# Patient Record
Sex: Male | Born: 1940
Health system: Southern US, Community
[De-identification: ages and names within clinical notes are randomized; demographics above are authoritative.]

## PROBLEM LIST (undated history)

## (undated) DIAGNOSIS — K603 Anal fistula, unspecified: Secondary | ICD-10-CM

## (undated) DIAGNOSIS — K648 Other hemorrhoids: Secondary | ICD-10-CM

## (undated) DIAGNOSIS — Z87438 Personal history of other diseases of male genital organs: Secondary | ICD-10-CM

## (undated) DIAGNOSIS — K635 Polyp of colon: Secondary | ICD-10-CM

## (undated) DIAGNOSIS — E785 Hyperlipidemia, unspecified: Secondary | ICD-10-CM

## (undated) DIAGNOSIS — I2510 Atherosclerotic heart disease of native coronary artery without angina pectoris: Secondary | ICD-10-CM

## (undated) DIAGNOSIS — K5090 Crohn's disease, unspecified, without complications: Secondary | ICD-10-CM

## (undated) DIAGNOSIS — H269 Unspecified cataract: Secondary | ICD-10-CM

## (undated) DIAGNOSIS — Z8719 Personal history of other diseases of the digestive system: Secondary | ICD-10-CM

## (undated) DIAGNOSIS — K219 Gastro-esophageal reflux disease without esophagitis: Secondary | ICD-10-CM

## (undated) DIAGNOSIS — I10 Essential (primary) hypertension: Secondary | ICD-10-CM

## (undated) DIAGNOSIS — K4090 Unilateral inguinal hernia, without obstruction or gangrene, not specified as recurrent: Secondary | ICD-10-CM

## (undated) DIAGNOSIS — T7840XA Allergy, unspecified, initial encounter: Secondary | ICD-10-CM

## (undated) DIAGNOSIS — K224 Dyskinesia of esophagus: Secondary | ICD-10-CM

## (undated) HISTORY — PX: HERNIA REPAIR: SHX51

## (undated) HISTORY — DX: Gastro-esophageal reflux disease without esophagitis: K21.9

## (undated) HISTORY — PX: LIPOMA EXCISION: SHX5283

## (undated) HISTORY — DX: Anal fistula, unspecified: K60.30

## (undated) HISTORY — DX: Hyperlipidemia, unspecified: E78.5

## (undated) HISTORY — DX: Allergy, unspecified, initial encounter: T78.40XA

## (undated) HISTORY — DX: Personal history of other diseases of the digestive system: Z87.19

## (undated) HISTORY — DX: Anal fistula: K60.3

## (undated) HISTORY — DX: Dyskinesia of esophagus: K22.4

## (undated) HISTORY — PX: COLONOSCOPY: SHX174

## (undated) HISTORY — PX: UPPER GASTROINTESTINAL ENDOSCOPY: SHX188

## (undated) HISTORY — DX: Atherosclerotic heart disease of native coronary artery without angina pectoris: I25.10

## (undated) HISTORY — DX: Crohn's disease, unspecified, without complications: K50.90

## (undated) HISTORY — DX: Other hemorrhoids: K64.8

## (undated) HISTORY — DX: Polyp of colon: K63.5

## (undated) HISTORY — DX: Unilateral inguinal hernia, without obstruction or gangrene, not specified as recurrent: K40.90

## (undated) HISTORY — PX: PROSTATE SURGERY: SHX751

## (undated) HISTORY — DX: Unspecified cataract: H26.9

## (undated) HISTORY — DX: Personal history of other diseases of male genital organs: Z87.438

## (undated) HISTORY — DX: Essential (primary) hypertension: I10

## (undated) HISTORY — PX: EYE SURGERY: SHX253

---

## 1998-10-20 ENCOUNTER — Ambulatory Visit (HOSPITAL_COMMUNITY): Admission: RE | Admit: 1998-10-20 | Discharge: 1998-10-20 | Payer: Self-pay | Admitting: Internal Medicine

## 1998-10-20 ENCOUNTER — Encounter: Payer: Self-pay | Admitting: Internal Medicine

## 2000-09-22 ENCOUNTER — Encounter (INDEPENDENT_AMBULATORY_CARE_PROVIDER_SITE_OTHER): Payer: Self-pay

## 2000-09-22 ENCOUNTER — Other Ambulatory Visit: Admission: RE | Admit: 2000-09-22 | Discharge: 2000-09-22 | Payer: Self-pay | Admitting: Internal Medicine

## 2001-05-09 ENCOUNTER — Emergency Department (HOSPITAL_COMMUNITY): Admission: EM | Admit: 2001-05-09 | Discharge: 2001-05-09 | Payer: Self-pay | Admitting: Emergency Medicine

## 2004-02-05 ENCOUNTER — Ambulatory Visit: Payer: Self-pay | Admitting: Internal Medicine

## 2005-04-20 ENCOUNTER — Ambulatory Visit: Payer: Self-pay | Admitting: Internal Medicine

## 2005-12-21 ENCOUNTER — Ambulatory Visit: Payer: Self-pay | Admitting: Internal Medicine

## 2006-01-03 ENCOUNTER — Ambulatory Visit (HOSPITAL_COMMUNITY): Admission: RE | Admit: 2006-01-03 | Discharge: 2006-01-03 | Payer: Self-pay | Admitting: Cardiology

## 2006-01-03 ENCOUNTER — Encounter: Payer: Self-pay | Admitting: Internal Medicine

## 2006-01-03 ENCOUNTER — Encounter (INDEPENDENT_AMBULATORY_CARE_PROVIDER_SITE_OTHER): Payer: Self-pay | Admitting: *Deleted

## 2006-01-10 ENCOUNTER — Ambulatory Visit: Payer: Self-pay | Admitting: Internal Medicine

## 2007-02-12 ENCOUNTER — Ambulatory Visit: Payer: Self-pay | Admitting: Internal Medicine

## 2007-05-29 ENCOUNTER — Ambulatory Visit: Payer: Self-pay | Admitting: Cardiology

## 2007-06-01 ENCOUNTER — Ambulatory Visit: Payer: Self-pay

## 2007-06-19 ENCOUNTER — Ambulatory Visit: Payer: Self-pay | Admitting: Cardiology

## 2007-08-16 ENCOUNTER — Ambulatory Visit: Payer: Self-pay | Admitting: Cardiology

## 2007-09-17 ENCOUNTER — Ambulatory Visit: Payer: Self-pay | Admitting: Cardiology

## 2007-09-17 LAB — CONVERTED CEMR LAB
BUN: 15 mg/dL (ref 6–23)
Calcium: 9.1 mg/dL (ref 8.4–10.5)
Creatinine, Ser: 0.9 mg/dL (ref 0.4–1.5)
Eosinophils Relative: 1.8 % (ref 0.0–5.0)
GFR calc Af Amer: 109 mL/min
Glucose, Bld: 85 mg/dL (ref 70–99)
HCT: 42.4 % (ref 39.0–52.0)
Hemoglobin: 14.5 g/dL (ref 13.0–17.0)
INR: 0.9 (ref 0.8–1.0)
Monocytes Absolute: 0.8 10*3/uL (ref 0.1–1.0)
Monocytes Relative: 11.7 % (ref 3.0–12.0)
Neutro Abs: 4.1 10*3/uL (ref 1.4–7.7)
RDW: 12.3 % (ref 11.5–14.6)
WBC: 6.9 10*3/uL (ref 4.5–10.5)

## 2007-09-20 ENCOUNTER — Ambulatory Visit: Payer: Self-pay | Admitting: Cardiology

## 2007-09-20 ENCOUNTER — Inpatient Hospital Stay (HOSPITAL_BASED_OUTPATIENT_CLINIC_OR_DEPARTMENT_OTHER): Admission: RE | Admit: 2007-09-20 | Discharge: 2007-09-20 | Payer: Self-pay | Admitting: Cardiology

## 2007-09-20 ENCOUNTER — Encounter: Payer: Self-pay | Admitting: Family Medicine

## 2007-10-09 ENCOUNTER — Ambulatory Visit: Payer: Self-pay | Admitting: Cardiology

## 2007-10-15 ENCOUNTER — Ambulatory Visit: Payer: Self-pay

## 2007-10-15 ENCOUNTER — Encounter: Payer: Self-pay | Admitting: Cardiology

## 2007-12-26 ENCOUNTER — Ambulatory Visit: Payer: Self-pay | Admitting: Cardiology

## 2008-02-05 ENCOUNTER — Ambulatory Visit: Payer: Self-pay | Admitting: Cardiology

## 2008-05-06 ENCOUNTER — Encounter: Payer: Self-pay | Admitting: Cardiology

## 2008-05-06 ENCOUNTER — Ambulatory Visit: Payer: Self-pay | Admitting: Cardiology

## 2008-05-06 DIAGNOSIS — I10 Essential (primary) hypertension: Secondary | ICD-10-CM

## 2008-05-06 DIAGNOSIS — I2510 Atherosclerotic heart disease of native coronary artery without angina pectoris: Secondary | ICD-10-CM | POA: Insufficient documentation

## 2008-05-06 DIAGNOSIS — K5090 Crohn's disease, unspecified, without complications: Secondary | ICD-10-CM

## 2008-05-06 DIAGNOSIS — E782 Mixed hyperlipidemia: Secondary | ICD-10-CM

## 2008-05-14 DIAGNOSIS — Z860100 Personal history of colon polyps, unspecified: Secondary | ICD-10-CM | POA: Insufficient documentation

## 2008-05-14 DIAGNOSIS — K603 Anal fistula, unspecified: Secondary | ICD-10-CM | POA: Insufficient documentation

## 2008-05-14 DIAGNOSIS — Z8719 Personal history of other diseases of the digestive system: Secondary | ICD-10-CM | POA: Insufficient documentation

## 2008-05-14 DIAGNOSIS — K648 Other hemorrhoids: Secondary | ICD-10-CM | POA: Insufficient documentation

## 2008-05-14 DIAGNOSIS — K224 Dyskinesia of esophagus: Secondary | ICD-10-CM

## 2008-05-19 ENCOUNTER — Ambulatory Visit: Payer: Self-pay | Admitting: Internal Medicine

## 2008-06-16 ENCOUNTER — Ambulatory Visit: Payer: Self-pay | Admitting: Family Medicine

## 2008-06-16 ENCOUNTER — Ambulatory Visit: Payer: Self-pay | Admitting: Cardiology

## 2008-06-30 ENCOUNTER — Telehealth: Payer: Self-pay | Admitting: Family Medicine

## 2008-06-30 LAB — CONVERTED CEMR LAB
AST: 30 units/L (ref 0–37)
Alkaline Phosphatase: 66 units/L (ref 39–117)
CO2: 26 meq/L (ref 19–32)
Calcium: 9.1 mg/dL (ref 8.4–10.5)
Creatinine, Ser: 1 mg/dL (ref 0.4–1.5)
Direct LDL: 160.7 mg/dL
Glucose, Bld: 82 mg/dL (ref 70–99)
Hemoglobin: 14 g/dL (ref 13.0–17.0)
MCHC: 33.7 g/dL (ref 30.0–36.0)
RDW: 12.6 % (ref 11.5–14.6)
Sodium: 145 meq/L (ref 135–145)
Total Bilirubin: 1.4 mg/dL — ABNORMAL HIGH (ref 0.3–1.2)

## 2008-07-01 ENCOUNTER — Encounter: Payer: Self-pay | Admitting: Internal Medicine

## 2008-07-11 ENCOUNTER — Telehealth: Payer: Self-pay | Admitting: Cardiology

## 2008-08-07 ENCOUNTER — Ambulatory Visit: Payer: Self-pay | Admitting: Family Medicine

## 2008-08-07 DIAGNOSIS — Z87448 Personal history of other diseases of urinary system: Secondary | ICD-10-CM

## 2008-09-22 ENCOUNTER — Telehealth: Payer: Self-pay | Admitting: Family Medicine

## 2008-11-25 ENCOUNTER — Ambulatory Visit: Payer: Self-pay | Admitting: Cardiology

## 2008-11-25 DIAGNOSIS — E7800 Pure hypercholesterolemia, unspecified: Secondary | ICD-10-CM

## 2008-11-28 LAB — CONVERTED CEMR LAB
Calcium: 9.7 mg/dL (ref 8.4–10.5)
Creatinine, Ser: 0.9 mg/dL (ref 0.4–1.5)
GFR calc non Af Amer: 89.2 mL/min (ref >60)
Glucose, Bld: 90 mg/dL (ref 70–99)
Sodium: 139 meq/L (ref 135–145)

## 2008-12-08 ENCOUNTER — Telehealth: Payer: Self-pay | Admitting: Cardiology

## 2008-12-08 ENCOUNTER — Ambulatory Visit: Payer: Self-pay | Admitting: Cardiology

## 2008-12-08 LAB — CONVERTED CEMR LAB
Calcium: 9.3 mg/dL (ref 8.4–10.5)
Chloride: 100 meq/L (ref 96–112)
Creatinine, Ser: 0.9 mg/dL (ref 0.4–1.5)
GFR calc non Af Amer: 89.19 mL/min (ref >60)

## 2008-12-22 ENCOUNTER — Ambulatory Visit: Payer: Self-pay | Admitting: Cardiology

## 2008-12-23 LAB — CONVERTED CEMR LAB
GFR calc non Af Amer: 78.97 mL/min (ref >60)
Glucose, Bld: 76 mg/dL (ref 70–99)
Potassium: 4 meq/L (ref 3.5–5.1)
Sodium: 139 meq/L (ref 135–145)

## 2008-12-29 ENCOUNTER — Ambulatory Visit: Payer: Self-pay | Admitting: Family Medicine

## 2009-01-15 ENCOUNTER — Telehealth: Payer: Self-pay | Admitting: Cardiology

## 2009-02-11 ENCOUNTER — Ambulatory Visit: Payer: Self-pay | Admitting: Family Medicine

## 2009-02-13 LAB — CONVERTED CEMR LAB
Albumin: 4.1 g/dL (ref 3.5–5.2)
Alkaline Phosphatase: 52 units/L (ref 39–117)
Bilirubin, Direct: 0.1 mg/dL (ref 0.0–0.3)
Cholesterol: 168 mg/dL (ref 0–200)
HDL: 48.8 mg/dL (ref >39.00)
LDL Cholesterol: 102 mg/dL — ABNORMAL HIGH (ref 0–99)
Total Bilirubin: 1.3 mg/dL — ABNORMAL HIGH (ref 0.3–1.2)
Total CHOL/HDL Ratio: 3
Triglycerides: 87 mg/dL (ref 0.0–149.0)

## 2009-03-24 ENCOUNTER — Ambulatory Visit: Payer: Self-pay | Admitting: Cardiology

## 2009-03-30 LAB — CONVERTED CEMR LAB
BUN: 13 mg/dL (ref 6–23)
CO2: 30 meq/L (ref 19–32)
Chloride: 105 meq/L (ref 96–112)
Glucose, Bld: 93 mg/dL (ref 70–99)
Potassium: 4.3 meq/L (ref 3.5–5.1)

## 2009-05-20 ENCOUNTER — Ambulatory Visit: Payer: Self-pay | Admitting: Internal Medicine

## 2009-06-17 ENCOUNTER — Ambulatory Visit: Payer: Self-pay | Admitting: Cardiology

## 2009-06-23 ENCOUNTER — Ambulatory Visit: Payer: Self-pay | Admitting: Cardiology

## 2009-06-25 LAB — CONVERTED CEMR LAB
BUN: 14 mg/dL (ref 6–23)
CO2: 26 meq/L (ref 19–32)
Calcium: 9.5 mg/dL (ref 8.4–10.5)
Glucose, Bld: 100 mg/dL — ABNORMAL HIGH (ref 70–99)
Sodium: 137 meq/L (ref 135–145)

## 2009-08-10 ENCOUNTER — Ambulatory Visit: Payer: Self-pay | Admitting: Family Medicine

## 2009-08-10 DIAGNOSIS — H918X9 Other specified hearing loss, unspecified ear: Secondary | ICD-10-CM

## 2009-08-12 ENCOUNTER — Telehealth: Payer: Self-pay | Admitting: Cardiology

## 2009-08-18 ENCOUNTER — Encounter: Payer: Self-pay | Admitting: Family Medicine

## 2009-08-18 ENCOUNTER — Ambulatory Visit: Payer: Self-pay | Admitting: Cardiology

## 2009-08-20 LAB — CONVERTED CEMR LAB
BUN: 14 mg/dL (ref 6–23)
Chloride: 105 meq/L (ref 96–112)
Creatinine, Ser: 1 mg/dL (ref 0.4–1.5)

## 2010-02-23 NOTE — Progress Notes (Signed)
Summary: does pt need blood work due to increase in med  Phone Note Call from Patient Call back at [employer] [phone]   Caller: Patient Reason for Call: Talk to Nurse Summary of Call: per pt called pt went to dr. Lorelei Pont for physical. ts prescribe LOSARTAN POTASSIUM 100 MG TABS 1 by mouth daily. does pt need to have blood work due to the increase in meds by dr. Lorelei Pont.  Initial call taken by: Neil Crouch,  August 12, 2009 9:40 AM  Follow-up for Phone Call        spoke w/pt Dr Edilia Bo had increased med due elevated BP at his physical, he states Dr Lia Foyer always checks labs after changing meds and thought he should have it checked, pt wants to come here for labs due to convenience, bmet sch for 7/26 Kevan Rosebush, RN  August 12, 2009 10:09 AM

## 2010-02-23 NOTE — Assessment & Plan Note (Signed)
Summary: Jeffery [name]  Visit Type:  Follow-up Referring Provider:  n/a Primary Provider:  SOwens LofflerMD  CC:  cough.  History of Present Illness: Doing well except for some dry cough, worse at night. Denies symptoms otherwise.  No chest pain with activity.    Current Medications (verified): 1)  Amlodipine Besylate 10 Mg  Tabs (Amlodipine Besylate) .... Once Daily 2)  Folic Acid 1 Mg Tabs (Folic Acid) .... Take 1 Tablet By Mouth Once A Day. 3)  Prilosec 20 Mg Cpdr (Omeprazole) .... Take 1 Tablet By Mouth Every Day 4)  Centrum Silver  Tabs (Multiple Vitamins-Minerals) .... Take 1 Once Daily 5)  Osteo Bi-Flex Regular Strength 250-200 Mg Tabs (Glucosamine-Chondroitin) .... Take 2 Once Daily 6)  Sulfasalazine 500 Mg Tabs (Sulfasalazine) .... Take 2 Tablet By Mouth Three Times A Day 7)  Hydrochlorothiazide 12.5 Mg Tabs (Hydrochlorothiazide) ..Marland Kitchen. 1 By Mouth Daily 8)  Lipitor 20 Mg Tabs (Atorvastatin Calcium) ..Marland Kitchen. 1 By Mouth At Bedtime 9)  Lisinopril 20 Mg Tabs (Lisinopril) .... Take One Tablet By Mouth Daily  Allergies (verified): No Known Drug Allergies  Past History:  Past Medical History: Last updated: 06/16/2008 Hx of ANAL FISTULA (ICD-565.1) ESOPHAGITIS, HX OF (ICD-V12.79) INTERNAL HEMORRHOIDS (ICD-455.0) COLONIC POLYPS, HYPERPLASTIC, HX OF (ICD-V12.72) ESOPHAGEAL MOTILITY DISORDER (ICD-530.5) CAD (ICD-414.00) CROHN'S DISEASE (ICD-555.9) HYPERLIPIDEMIA TYPE IIB / III (ICD-272.2) HYPERTENSION, BENIGN (ICD-401.1)  Vital Signs:  Patient profile:   70year old male Height:      72 inches Weight:      202 pounds BMI:     27.50 Pulse rate:   72 / minute Resp:     18 per minute BP sitting:   148 / 88  (left arm)  Vitals Entered By: CLevora Angel CNA (Jun 17, 2009 11:55 AM)  Physical Exam  General:  Well developed, well nourished, in no acute distress. Head:  normocephalic and atraumatic Eyes:  PERRLA/EOM intact; conjunctiva and lids normal. Neck:  No JVP elevation Lungs:   Clear bilaterally to auscultation and percussion. Heart:  Non-displaced PMI, chest non-tender; regular rate and rhythm, S1, S2 without murmurs, rubs or gallops. Carotid upstroke normal, no bruit.  Pulses:  pulses normal in all 4 extremities Extremities:  No clubbing or cyanosis. Neurologic:  Alert and oriented x 3.   EKG  Procedure date:  06/17/2009  Findings:      NSR. WNL.  Cardiac Cath  Procedure date:  09/20/2007  Findings:       ANGIOGRAPHIC DATA:   1. Ventriculography was done in the RAO projection.  Overall systolic       function appeared preserved.  No definite segmental abnormalities       or contraction were identified.   2. The left main does demonstrate a little bit of tenting at the       ostium.  Multiple views were obtained, and there was good efflux of       contrast back into the aorta.  High-grade obstruction is not noted,       but the tenting would represent probably about a 30% overall       luminal reduction.  The left main itself appears to be smooth.   3. The left anterior descending artery courses to the apex.  The first       tiny diagonal branch in the LAO view does demonstrate some       narrowing, probably of 90%, but this is a 1-mm vessel or less.  The  second diagonal branch is a large-caliber vessel free of critical       disease in the mid and distal LAD.  All appear free of significant       high-grade disease.   4. The ramus intermedius is a moderate-size vessel with minimal       proximal luminal irregularity.   5. The circumflex coronary artery provides 2 marginal branches and has       no significant obstruction.   6. The right coronary artery also was minimally tapered at the ostium.       The right coronary, however, is smooth throughout providing a       posterior descending and posterolateral branch with minimal or mild       plaquing at the origin of the PDA, which is not hemodynamically       significant.      CONCLUSIONS:    1. Well-preserved left ventricular function.   2. A 30% ostial narrowing of the left main that does not appear to be       high-grade or flow-limiting.   3. Small first diagonal branch with abnormality noted in the LAO       cranial view.   4. Other findings as noted above.   Impression & Recommendations:  Problem # 1:  HYPERTENSION, BENIGN (ICD-401.1)  well controlled.  ACE cough.  Change to ARB.  Risks of meds reviewed.  Agreeble.  Check BMET after several days of treatment.   His updated medication list for this problem includes:    Amlodipine Besylate 10 Mg Tabs (Amlodipine besylate) ..... Once daily    Hydrochlorothiazide 12.5 Mg Tabs (Hydrochlorothiazide) .Marland Kitchen... 1 by mouth daily    Losartan Potassium 50 Mg Tabs (Losartan potassium) .Marland Kitchen... Take one tabley by mouth daily  Orders: EKG w/ Interpretation (93000)  Problem # 2:  HYPERCHOLESTEROLEMIA  IIA (ICD-272.0)  followed by Dr. Edilia Bo His updated medication list for this problem includes:    Lipitor 20 Mg Tabs (Atorvastatin calcium) .Marland Kitchen... 1 by mouth at bedtime  His updated medication list for this problem includes:    Lipitor 20 Mg Tabs (Atorvastatin calcium) .Marland Kitchen... 1 by mouth at bedtime  Problem # 3:  CAD (ICD-414.00) Not on ASA due to Crohn's  disease.  Stable without.   His updated medication list for this problem includes:    Amlodipine Besylate 10 Mg Tabs (Amlodipine besylate) ..... Once daily  His updated medication list for this problem includes:    Amlodipine Besylate 10 Mg Tabs (Amlodipine besylate) ..... Once daily  Orders: EKG w/ Interpretation (93000)  Patient Instructions: 1)  Your physician recommends that you return for lab work on: Tuesday Jun 23, 3009 (BMP 414.01, 401.9, 272.0)--Lab opens at 8:30 2)  Your physician has recommended you make the following change in your medication: Take your last dose of Lisinopril on Friday, Start Losartan 57m once a day on Saturday 3)  Your physician wants you to  follow-up in: 1 YEAR.   You will receive a reminder letter in the mail two months in advance. If you don't receive a letter, please call our office to schedule the follow-up appointment. Prescriptions: LOSARTAN POTASSIUM 50 MG TABS (LOSARTAN POTASSIUM) take one tabley by mouth daily  #90 x 3   Entered by:   LTheodosia Quay RN, BSN   Authorized by:   TWadie Lessen MD, [facility]  Signed by:   LTheodosia Quay RN, BSN on 06/17/2009   Method used:   Electronically  to        [id number]       [address]       [place], [place]  [address]       Ph: [phone]       Fax: [phone]   RxID:   [id number]

## 2010-02-23 NOTE — Assessment & Plan Note (Signed)
Summary: CPX.DLO   Vital Signs:  Patient profile:   70 year old male Weight:      198 pounds Temp:     98.8 degrees F oral Pulse rate:   72 / minute Pulse rhythm:   regular BP sitting:   112 / 68  (left arm) Cuff size:   regular  Vitals Entered By: Emelia Salisbury LPN (August 11, 5327 9:24 PM)  Serial Vital Signs/Assessments:  Time      Position  BP       Pulse  Resp  Temp     By                     135/78                         Owens Loffler MD  CC: 30 Minute checkup  Vision Screening:Left eye with correction: 20 / 20 Right eye with correction: 20 / 30 Both eyes with correction: 20 / 20        20db HL: Left  500 hz: 25db 1000 hz: 25db 2000 hz: 25db 4000 hz: No Response Right  500 hz: 25db 1000 hz: No Response 2000 hz: No Response 4000 hz: 25db    CC:  30 Minute checkup.  History of Present Illness: Here for Medicare AWV:  1.   Risk factors based on Past M, S, F history: 2.   Physical Activities: treadmill and walking 3.   Depression/mood: feels fine, no mood disturbance 4.   Hearing: note above, will go to audiology 5.   ADL's: no deficit 6.   Fall Risk: none, active, strong 7.   Home Safety: none noted 8.   Height, weight, &visual acuity: recorded 9.   Counseling: no dep / anxiety, feeling fine, doing well  10.   Labs ordered based on risk factors:  11.           Referral Coordination: Dr. Olevia Perches and Dr. Lia Foyer, sees reg 12.           Care Plan: up to date, vaccines, colon 13.           Cognitive Assessment: A and O x 4, no memory disturbance, working full time, keeps all financial records, no concerns.  Hearing - decreased   noticed lots of cramps in his leg and with his leg bent back some  some lgithheadedness rarely with standing up  HTN: noted now. is then elevated above goal at home, and he is also above goal my recheck here in the office today.  treadmill or walking  Crohns, good f/u with Dr. Olevia Perches, rare signs  CAD, on appropriate meds,  sees Dr. Lia Foyer regularly  Clinical Review Panels:  Prevention   Last Colonoscopy:  Location:  [facility].  (01/03/2006)   Last PSA:  0.86 (02/11/2009)  Immunizations   Last Flu Vaccine:  Fluvax 3+ (12/29/2008)   Last Pneumovax:  given (08/07/2008)   Last Zoster Vaccine:  given (08/07/2008)  Lipid Management   Cholesterol:  168 (02/11/2009)   LDL (bad choesterol):  102 (02/11/2009)   HDL (good cholesterol):  48.80 (02/11/2009)  Diabetes Management   Creatinine:  1.0 (06/23/2009)   Last Flu Vaccine:  Fluvax 3+ (12/29/2008)   Last Pneumovax:  given (08/07/2008)  CBC   WBC:  4.6 (06/16/2008)   RBC:  4.51 (06/16/2008)   Hgb:  14.0 (06/16/2008)   Hct:  41.4 (06/16/2008)   Platelets:  232.0 (  06/16/2008)   MCV  91.8 (06/16/2008)   MCHC  33.7 (06/16/2008)   RDW  12.6 (06/16/2008)   PMN:  59.7 (09/17/2007)   Lymphs:  25.9 (09/17/2007)   Monos:  11.7 (09/17/2007)   Eosinophils:  1.8 (09/17/2007)   Basophil:  0.9 (09/17/2007)  Complete Metabolic Panel   Glucose:  100 (06/23/2009)   Sodium:  137 (06/23/2009)   Potassium:  4.7 (06/23/2009)   Chloride:  100 (06/23/2009)   CO2:  26 (06/23/2009)   BUN:  14 (06/23/2009)   Creatinine:  1.0 (06/23/2009)   Albumin:  4.1 (02/11/2009)   Total Protein:  7.0 (02/11/2009)   Calcium:  9.5 (06/23/2009)   Total Bili:  1.3 (02/11/2009)   Alk Phos:  52 (02/11/2009)   SGPT (ALT):  30 (02/11/2009)   SGOT (AST):  24 (02/11/2009)   Allergies (verified): 1)  Ace Inhibitors  Past History:  Past medical, surgical, family and social histories (including risk factors) reviewed, and no changes noted (except as noted below).  Past Medical History: Reviewed history from 06/16/2008 and no changes required. Hx of ANAL FISTULA (ICD-565.1) ESOPHAGITIS, HX OF (ICD-V12.79) INTERNAL HEMORRHOIDS (ICD-455.0) COLONIC POLYPS, HYPERPLASTIC, HX OF (ICD-V12.72) ESOPHAGEAL MOTILITY DISORDER (ICD-530.5) CAD (ICD-414.00) CROHN'S DISEASE  (ICD-555.9) HYPERLIPIDEMIA TYPE IIB / III (ICD-272.2) HYPERTENSION, BENIGN (ICD-401.1)  Past Surgical History: Reviewed history from 06/16/2008 and no changes required. Eye surgery Hernia repair x 2 Lipoma of chest Prostate surgery x 2 ([facility], 1996)  Cath, 2009 ([place])  Family History: Reviewed history from 05/20/2009 and no changes required. M  passed from failure to thrive 82 Colonic polyps F 58 MI, was a smoker Family History of Colon Polyps: Mother No FH of Colon Cancer:  Social History: Reviewed history from 05/20/2009 and no changes required. married, two grils Solicitor for [employer] Patient is a former smoker.  Alcohol Use - yes occ Illicit Drug Use - no  Review of Systems      See HPI General:  Denies chills and fever. Eyes:  Denies blurring. ENT:  Complains of decreased hearing. CV:  Denies chest pain or discomfort, shortness of breath with exertion, swelling of feet, and swelling of hands. Resp:  Denies cough, shortness of breath, and wheezing. GI:  See HPI. GU:  Complains of decreased libido; denies discharge, erectile dysfunction, urinary frequency, and urinary hesitancy. MS:  Denies stiffness. Derm:  Denies rash. Neuro:  Denies memory loss and tingling. Psych:  Denies anxiety and depression. Endo:  Denies cold intolerance, excessive hunger, excessive thirst, excessive urination, heat intolerance, polyuria, and weight change. Heme:  Denies bleeding. Allergy:  Denies seasonal allergies.   Impression & Recommendations:  Problem # 1:  HEALTH MAINTENANCE EXAM (ICD-V70.0)  Health maintenance updated, POC reviewed  Orders: Subsequent annual wellness visit with prevention plan ([id number])  Problem # 2:  HYPERTENSION, BENIGN (ICD-401.1) Assessment: Deteriorated 734'L systolic, not at goal increase ARB cough much better off ace  His updated medication list for this problem includes:    Amlodipine Besylate 10 Mg Tabs (Amlodipine  besylate) ..... Once daily    Hydrochlorothiazide 12.5 Mg Tabs (Hydrochlorothiazide) .Marland Kitchen... 1 by mouth daily    Losartan Potassium 100 Mg Tabs (Losartan potassium) .Marland Kitchen... 1 by mouth daily  Problem # 3:  OTHER SPECIFIED FORMS OF HEARING LOSS (ICD-389.8) Assessment: New audiology formal referral  Orders: Audiology (Audio)  Problem # 4:  HYPERCHOLESTEROLEMIA  IIA (ICD-272.0) Assessment: Improved  His updated medication list for this problem includes:    Lipitor 20 Mg Tabs (Atorvastatin  calcium) .Marland Kitchen... 1 by mouth at bedtime  Problem # 5:  CROHN'S DISEASE (ICD-555.9) good control  Problem # 6:  CAD (ICD-414.00) crohns so no asa, but good protocol  His updated medication list for this problem includes:    Amlodipine Besylate 10 Mg Tabs (Amlodipine besylate) ..... Once daily    Hydrochlorothiazide 12.5 Mg Tabs (Hydrochlorothiazide) .Marland Kitchen... 1 by mouth daily    Losartan Potassium 100 Mg Tabs (Losartan potassium) .Marland Kitchen... 1 by mouth daily  Complete Medication List: 1)  Amlodipine Besylate 10 Mg Tabs (Amlodipine besylate) .... Once daily 2)  Folic Acid 1 Mg Tabs (Folic acid) .... Take 1 tablet by mouth once a day. 3)  Prilosec 20 Mg Cpdr (Omeprazole) .... Take 1 tablet by mouth every day 4)  Centrum Silver Tabs (Multiple vitamins-minerals) .... Take 1 once daily 5)  Osteo Bi-flex Regular Strength 250-200 Mg Tabs (Glucosamine-chondroitin) .... Take 2 once daily 6)  Sulfasalazine 500 Mg Tabs (Sulfasalazine) .... Take 2 tablet by mouth three times a day 7)  Hydrochlorothiazide 12.5 Mg Tabs (Hydrochlorothiazide) .Marland Kitchen.. 1 by mouth daily 8)  Lipitor 20 Mg Tabs (Atorvastatin calcium) .Marland Kitchen.. 1 by mouth at bedtime 9)  Losartan Potassium 100 Mg Tabs (Losartan potassium) .Marland Kitchen.. 1 by mouth daily  Other Orders: Audiometry [phone]) Vision Screening [phone])  Patient Instructions: 1)  Referral Appointment Information 2)  Day/Date: 3)  Time: 4)  Place/MD: 5)  Address: 6)  Phone/Fax: 7)  Patient given  appointment information. Information/Orders faxed/mailed.  Prescriptions: LOSARTAN POTASSIUM 100 MG TABS (LOSARTAN POTASSIUM) 1 by mouth daily  #90 x 3   Entered and Authorized by:   Owens Loffler MD   Signed by:   Owens Loffler MD on 08/10/2009   Method used:   [facility] to        [id number]       [address]       [place], [place]  [address]       Ph: [phone]       Fax: [phone]   RxID:   [id number]   Current Allergies (reviewed today): ACE INHIBITORS   Prevention & Chronic Care Immunizations   Influenza vaccine: Fluvax 3+  (12/29/2008)   Influenza vaccine due: 12/29/2009    Tetanus booster: Not documented    Pneumococcal vaccine: given  (08/07/2008)   Pneumococcal vaccine due: None    H. zoster vaccine: 08/07/2008: given  Colorectal Screening   Hemoccult: not done  (08/10/2009)    Colonoscopy: Location:  [facility].    (01/03/2006)   Colonoscopy action/deferral: Not indicated  (08/10/2009)   Colonoscopy due: 12/2010  Other Screening   PSA: 0.86  (02/11/2009)   PSA action/deferral: PSA ordered  (08/07/2008)   PSA due due: 02/11/2010   Smoking status: quit  (05/14/2008)  Lipids   Total Cholesterol: 168  (02/11/2009)   LDL: 102  (02/11/2009)   LDL Direct: 160.7  (06/16/2008)   HDL: 48.80  (02/11/2009)   Triglycerides: 87.0  (02/11/2009)    SGOT (AST): 24  (02/11/2009)   SGPT (ALT): 30  (02/11/2009)   Alkaline phosphatase: 52  (02/11/2009)   Total bilirubin: 1.3  (02/11/2009)    Lipid flowsheet reviewed?: Yes   Progress toward LDL goal: Improved  Hypertension   Last Blood Pressure: 112 / 68  (08/10/2009)   Serum creatinine: 1.0  (06/23/2009)   Serum potassium 4.7  (06/23/2009)    Hypertension flowsheet reviewed?: Yes  Progress toward BP goal: Unchanged  Self-Management Support :    Patient will work on the following items until the next clinic visit to reach self-care  goals:     Medications and monitoring: take my medicines every day, check my blood pressure  (08/10/2009)     Eating: eat foods that are low in salt, eat baked foods instead of fried foods  (08/10/2009)    Hypertension self-management support: BP self-monitoring log  (08/10/2009)    Lipid self-management support: Not documented     General Medical Physical Exam:  General Appearance:      Well developed, well nourished, in no acute distress  Head:      Inspection:     normocephalic without obvious abnormalities      Palpation:     no abnormal lesions palpable  Eyes:      External:     conjunctiva and lids normal      Pupils:     equal, round, and reactive to light and accommodation  Ears, Nose, Throat:      External:     no significant lesions or deformities noted      Otoscopic:     canals clear; tympanic membranes intact with normal light reflex      Hearing:     grossly intact      Nasal:     mucosa, septum, and turbinates normal      Dental:     good dentition      Pharynx:     tongue normal; posterior pharynx without erythema or exudate  Neck:      Thyroid:     no nodules, masses, tenderness, or enlargement  Respiratory:      Resp. effort:     no intercostal retractions or use of accessory muscles      Percussion:     no dullness      Palpation:     normal fremitus      Auscultation:     no rales, rhonchi, or wheezes  Chest Wall:      Chest wall:     no masses or gynecomastia  Cardiovascular:      Palpation:     no thrill or displacement of PMI      Auscultation:     normal S1 and  S2; no murmur, rub, or gallop  Gastrointestinal:      Abdomen:     soft and non-tender with normal bowel sounds; no masses      Liver/spleen:     normal to percussion; no enlargement or nodularity      Rectal:     normal rectal tone. No perianal disease. Stool is Hemoccult negative.      Stool:     not done  Genitourinary:      Scrotum:     no lesions, cysts, edema, or rash       Penis:     no lesions or discharge      Prostate:     no enlargement or nodularity  Musculoskeletal:      Gait/station:     normal gait; no ataxia      Digits/nails:     no cyanosis, clubbing, or petechiae  Lymphatic:      Neck:     no cervical adenopathy      Inguinal:     no inguinal adenopathy  Skin:      Inspection:     no rashes, suspicious lesions, or ulcerations  Palpation:     no subcutaneous nodules or induration  Neurological:      Sensory:     intact to touch  Psychiatric:      Judgement:     intact      Orientation:     oriented to time, place, and person      Memory:     intact for recent and remote events      Mood/affect:     no appearance of anxiety, depression, or agitation

## 2010-02-23 NOTE — Consult Note (Signed)
Summary: [facility]  [facility]   Imported By: Edmonia James 08/24/2009 10:37:20  _____________________________________________________________________  External Attachment:    Type:   Image     Comment:   External Document

## 2010-02-23 NOTE — Assessment & Plan Note (Signed)
Summary: 1 YR. F/U--CH.   History of Present Illness Visit Type: Follow-up Visit Primary GI MD: [name] MD Primary Provider: Owens Loffler MD Chief Complaint: 1 year follow up, Pt has no GI complaints History of Present Illness:   This is a 70 year old white male with Crohn's disease since 34. He is here for a yearly checkup. Patient's last visit was in April 2010. Patient is status post hemorrhoidal band ligation in 2007. Other medical problems include esophageal dysmotility, TURP and a history of colon polyps in 1999 and in 1994. Patient has been asymptomatic as for far as his Crohn's disease is concerned. He denies diarrhea, rectal bleeding or weight changes. He has been on Azulfadine 500 mg 2 tablets three times daily along with folic acid. He takes Prilosec 20 mg p.r.n. basis. His weight has increased 8 lbs.   GI Review of Systems      Denies abdominal pain, acid reflux, belching, bloating, chest pain, dysphagia with liquids, dysphagia with solids, heartburn, loss of appetite, nausea, vomiting, vomiting blood, weight loss, and  weight gain.        Denies anal fissure, black tarry stools, change in bowel habit, constipation, diarrhea, diverticulosis, fecal incontinence, heme positive stool, hemorrhoids, irritable bowel syndrome, jaundice, light color stool, liver problems, rectal bleeding, and  rectal pain.    Current Medications (verified): 1)  Amlodipine Besylate 10 Mg  Tabs (Amlodipine Besylate) .... Once Daily 2)  Folic Acid 1 Mg Tabs (Folic Acid) .... Take 1 Tablet By Mouth Once A Day..must Have Office Visit For Future Refills. 3)  Prilosec Otc 20 Mg Tbec (Omeprazole Magnesium) .... Take 1 As Needed 4)  Centrum Silver  Tabs (Multiple Vitamins-Minerals) .... Take 1 Once Daily 5)  Osteo Bi-Flex Regular Strength 250-200 Mg Tabs (Glucosamine-Chondroitin) .... Take 2 Once Daily 6)  Sulfasalazine 500 Mg Tabs (Sulfasalazine) .... Take 2 Tablet By Mouth Three Times A Day 7)   Hydrochlorothiazide 12.5 Mg Tabs (Hydrochlorothiazide) .Marland Kitchen.. 1 By Mouth Daily 8)  Lipitor 20 Mg Tabs (Atorvastatin Calcium) .Marland Kitchen.. 1 By Mouth At Bedtime 9)  Lisinopril 20 Mg Tabs (Lisinopril) .... Take One Tablet By Mouth Daily  Allergies (verified): No Known Drug Allergies  Past History:  Past Medical History: Reviewed history from 06/16/2008 and no changes required. Hx of ANAL FISTULA (ICD-565.1) ESOPHAGITIS, HX OF (ICD-V12.79) INTERNAL HEMORRHOIDS (ICD-455.0) COLONIC POLYPS, HYPERPLASTIC, HX OF (ICD-V12.72) ESOPHAGEAL MOTILITY DISORDER (ICD-530.5) CAD (ICD-414.00) CROHN'S DISEASE (ICD-555.9) HYPERLIPIDEMIA TYPE IIB / III (ICD-272.2) HYPERTENSION, BENIGN (ICD-401.1)  Past Surgical History: Reviewed history from 06/16/2008 and no changes required. 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  Alcohol Use - yes occ Illicit Drug Use - no  Review of Systems  The patient denies allergy/sinus, anemia, anxiety-new, arthritis/joint pain, back pain, blood in urine, breast changes/lumps, change in vision, confusion, cough, coughing up blood, depression-new, fainting, fatigue, fever, headaches-new, hearing problems, heart murmur, heart rhythm changes, itching, muscle pains/cramps, night sweats, nosebleeds, shortness of breath, skin rash, sleeping problems, sore throat, swelling of feet/legs, swollen lymph glands, thirst - excessive, urination - excessive, urination changes/pain, urine leakage, vision changes, and voice change.         Pertinent  positive and negative review of systems were noted in the above HPI. All other ROS was otherwise negative.   Vital  Signs:  Patient profile:   70 year old male Height:      72 inches Weight:      201 pounds BMI:     27.36 BSA:     2.14 Pulse rate:   68 / minute Pulse rhythm:   regular BP sitting:   118 / 60  Vitals Entered By: Genella Mech CMA Deborra Medina) (May 20, 2009 8:08 AM)  Physical Exam  General:  Well developed, well nourished, no acute distress. Eyes:  PERRLA, no icterus. Mouth:  No deformity or lesions, dentition normal. Neck:  Supple; no masses or thyromegaly. Lungs:  Clear throughout to auscultation. Heart:  Regular rate and rhythm; no murmurs, rubs,  or bruits. Abdomen:  Soft, nontender and nondistended. No masses, hepatosplenomegaly or hernias noted. Normal bowel sounds. Rectal:  normal rectal tone. No perianal disease. Stool is Hemoccult negative. Pulses:  Normal pulses noted. Extremities:  No clubbing, cyanosis, edema or deformities noted. Skin:  Intact without significant lesions or rashes. Psych:  Alert and cooperative. Normal mood and affect.   Impression & Recommendations:  Problem # 1:  CROHN'S DISEASE (ICD-555.9) Patient has had stable Crohn's disease of the terminal ileum since 1986. He is asymptomatic on Azulfadine 1 g twice a day and folic acid 1 mg ever day. He will continue the same regimen. He is up-to-date on his colonoscopy at this time.  Problem # 2:  Hx of ANAL FISTULA (ICD-565.1) Patient has periodic rectal irritation but no fistula on today's exam. Stool is Hemoccult negative. He will use over-the-counter topical preparations for rectal irritation.  Problem # 3:  ESOPHAGITIS, HX OF (ICD-V12.79) Patient has mild esophageal dysmotility which is well-controlled on p.r.n. Prilosec. There has been no progression of his symptoms.  Patient Instructions: 1)  continue Prilosec 20 mg p.o. p.r.n. 2)  Continued Azulfidine  1 g p.o. b.i.d. 3)  Recall colonoscopy December 2012. 4)  office visit one year. 5)  Copy sent to : Dr Lorelei Pont 6)  The medication list was  reviewed and reconciled.  All changed / newly prescribed medications were explained.  A complete medication list was provided to the patient / caregiver. Prescriptions: PRILOSEC 20 MG CPDR (OMEPRAZOLE) Take 1 tablet by mouth every day  #30 x 11   Entered by:   Madlyn Frankel CMA (AAMA)   Authorized by:   Lafayette Dragon MD   Signed by:   T[place]ley ([place]) on 05/20/2009   Method used:   [facility]        [phone][facility] (retail)       [address]       [place],   [address]       Ph: [phone]       Fax: [phone]   RxID:   [id number]

## 2010-03-10 ENCOUNTER — Ambulatory Visit (INDEPENDENT_AMBULATORY_CARE_PROVIDER_SITE_OTHER): Payer: Medicare Other | Admitting: Family Medicine

## 2010-03-10 ENCOUNTER — Encounter: Payer: Self-pay | Admitting: Family Medicine

## 2010-03-10 DIAGNOSIS — H6990 Unspecified Eustachian tube disorder, unspecified ear: Secondary | ICD-10-CM

## 2010-03-10 DIAGNOSIS — H6980 Other specified disorders of Eustachian tube, unspecified ear: Secondary | ICD-10-CM | POA: Insufficient documentation

## 2010-03-17 NOTE — Assessment & Plan Note (Signed)
Summary: CONGESTION/CLE   MEDICARE/UHC   Vital Signs:  Patient profile:   70 year old male Weight:      198.25 pounds Temp:     98.3 degrees F oral Pulse rate:   76 / minute Pulse rhythm:   regular BP sitting:   108 / 78  (left arm) Cuff size:   large CC: Head congestion x3 weeks Comments Ear pressure, headache   History of Present Illness: CC: head congestion  3wk h/o "stuffed up head" as well as L ear soreness and squeaking.  + clear nasal discharge and some blood.  Also with L sided headache.  Feels stuck and not improving.  Taking pseudophed.  No hearing changes or tinnitus.  No fevers/chills, abd pain, n/v/d, rashes, myalgias, arthralgias.  No tooth pain.  + granddaughter sick 2 wks ago, no smokers at home.  No h/o asthma.  Current Medications (verified): 1)  Amlodipine Besylate 10 Mg  Tabs (Amlodipine Besylate) .... Once Daily 2)  Folic Acid 1 Mg Tabs (Folic Acid) .... Take 1 Tablet By Mouth Once A Day. 3)  Prilosec 20 Mg Cpdr (Omeprazole) .... Take 1 Tablet By Mouth Every Day 4)  Centrum Silver  Tabs (Multiple Vitamins-Minerals) .... Take 1 Once Daily 5)  Osteo Bi-Flex Regular Strength 250-200 Mg Tabs (Glucosamine-Chondroitin) .... Take 2 Once Daily 6)  Sulfasalazine 500 Mg Tabs (Sulfasalazine) .... Take 2 Tablet By Mouth Three Times A Day 7)  Hydrochlorothiazide 12.5 Mg Tabs (Hydrochlorothiazide) .Marland Kitchen.. 1 By Mouth Daily 8)  Lipitor 20 Mg Tabs (Atorvastatin Calcium) .Marland Kitchen.. 1 By Mouth At Bedtime 9)  Losartan Potassium 100 Mg Tabs (Losartan Potassium) .Marland Kitchen.. 1 By Mouth Daily  Allergies: 1)  Ace Inhibitors  Past History:  Past Medical History: Last updated: 06/16/2008 Hx of ANAL FISTULA (ICD-565.1) ESOPHAGITIS, HX OF (ICD-V12.79) INTERNAL HEMORRHOIDS (ICD-455.0) COLONIC POLYPS, HYPERPLASTIC, HX OF (ICD-V12.72) ESOPHAGEAL MOTILITY DISORDER (ICD-530.5) CAD (ICD-414.00) CROHN'S DISEASE (ICD-555.9) HYPERLIPIDEMIA TYPE IIB / III (ICD-272.2) HYPERTENSION, BENIGN  (ICD-401.1)  Past Surgical History: Last updated: 06/16/2008 Eye surgery Hernia repair x 2 Lipoma of chest Prostate surgery x 2 (TURP, 1996)  Cath, 2009 ([facility])  Social History: Last updated: 05/20/2009 married, 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  Alcohol Use - yes occ Illicit Drug Use - no  Review of Systems       per HPI  Physical Exam  General:  Well developed, well nourished, in no acute distress Head:  no significant lesions or deformities noted  no sinus tenderness Eyes:  pupils equal, pupils round, pupils reactive to light, and pupils react to accomodation.   Ears:  TMs clear bilaterally, canals clear Nose:  mild mucosal pallor Mouth:  Oral mucosa and oropharynx without lesions or exudates.  Teeth in good repair. Neck:  No deformities, masses, or tenderness no LAD Lungs:  Normal respiratory effort, chest expands symmetrically. Lungs are clear to auscultation, no crackles or wheezes. Heart:  Regular rate and rhythm; no murmurs, rubs or bruits.   Impression & Recommendations:  Problem # 1:  EUSTACHIAN TUBE DYSFUNCTION, LEFT (ICD-381.81) no evidence of infection currently.  recommend start flonase as well as saline, guaifenesin.  update if not improving.  red flags to return discussed.  Complete Medication List: 1)  Amlodipine Besylate 10 Mg Tabs (Amlodipine besylate) .... Once daily 2)  Folic Acid 1 Mg Tabs (Folic acid) .... Take 1 tablet by mouth once a day. 3)  Prilosec 20 Mg Cpdr (Omeprazole) .... Take 1 tablet  by mouth every day 4)  Centrum Silver Tabs (Multiple vitamins-minerals) .... Take 1 once daily 5)  Osteo Bi-flex Regular Strength 250-200 Mg Tabs (Glucosamine-chondroitin) .... Take 2 once daily 6)  Sulfasalazine 500 Mg Tabs (Sulfasalazine) .... Take 2 tablet by mouth three times a day 7)  Hydrochlorothiazide 12.5 Mg Tabs (Hydrochlorothiazide) .Marland Kitchen.. 1 by mouth daily 8)  Lipitor 20 Mg Tabs (Atorvastatin  calcium) .Marland Kitchen.. 1 by mouth at bedtime 9)  Losartan Potassium 100 Mg Tabs (Losartan potassium) .Marland Kitchen.. 1 by mouth daily 10)  Flonase 50 Mcg/act Susp (Fluticasone propionate) .... 2 sprays in l nostril daily  Patient Instructions: 1)  Sounds like sinuses are somewhat congested. 2)  try either neti pot to drain sinuses or nasal saline spray. 3)  simple mucinex with plenty of fluid to help break up congestion. 4)  I've sent in medicine called flonase, use on L side 2 sprays daily. 5)  If not better, let us know. 6)  if worsening or fevers (>101.5), or worsening nasal discharge, you may need to return. Prescriptions: FLONASE 50 MCG/ACT SUSP (FLUTICASONE PROPIONATE) 2 sprays in L nostril daily  #1 x 1   Entered and Authorized by:   Ria Bush  MD   Signed by:   Ria Bush  MD on 03/10/2010   Method used:   Electronically to        [facility] [phone]* (retail)       [address]       [place], [place]  [address]       Ph: [phone]       Fax: [phone]   RxID:   [id number]    Orders Added: 1)  Est. Patient Level III [37944]    Current Allergies (reviewed today): ACE INHIBITORS  Appended Document: CONGESTION/CLE   [place]    Clinical Lists Changes  Orders: Added new Service order of Prescription Created Electronically [phone]) - Signed

## 2010-04-23 ENCOUNTER — Other Ambulatory Visit: Payer: Self-pay | Admitting: Internal Medicine

## 2010-06-08 NOTE — Cardiovascular Report (Signed)
NAME:  ISAISH, ALEMU NO.:  [id number]   MEDICAL RECORD NO.:  [id number]          PATIENT TYPE:  OIB   LOCATION:  1962                         FACILITY:  [facility]   PHYSICIAN:  [name]. Lia Foyer, MD, FACCDATE OF BIRTH:  10/12/1940   DATE OF PROCEDURE:  09/20/2007  DATE OF DISCHARGE:                            CARDIAC CATHETERIZATION   INDICATIONS:  Mr. Steil is a 70 year old gentleman who presents with  some exertional chest tightness.  This has been noticeable over the past  few months.  He had a borderline myocardial perfusion imaging study,  without a large area of high-risk ischemia.  Nonetheless, given his  symptoms, it was felt that cardiac catheterization would be worthwhile.  Risks, benefits, and alternatives were discussed with the patient in  detail, and he consented to proceed.   PROCEDURES:  1. Left heart catheterization.  2. Selective coronary arteriography.  3. Selective left ventriculography.   DESCRIPTION OF PROCEDURE:  The patient was brought to the  Catheterization Laboratory and prepped and draped in the usual fashion.  Through an anterior puncture, the femoral artery was entered, 4-French  sheath was placed.  We then took views of the left coronary artery.  There was very mild damping with engagement, we elected to use some  intracoronary nitroglycerin and exchanged for a 5-curve catheter.  With  this, after injection, there was some mild damping, not present before  an injection.  Overall, there was no evidence of calcification of the  left main artery.  Views of the left coronary artery were obtained  without complication.  A Noto catheter was used to inject the right  coronary vessel.  Ventriculography was performed in the RAO projection.  There were no complications.  He was taken to the holding area in  satisfactory clinical condition for sheath removal.  I spoke with his  family, and reviewed the pictures with them in the reviewing  room.   HEMODYNAMIC DATA:  1. Central aortic pressure is 133/70, mean 95.  2. Left ventricular pressure 142/14.  3. There was not a gradient on pullback across the aortic valve.   ANGIOGRAPHIC DATA:  1. Ventriculography was done in the RAO projection.  Overall systolic      function appeared preserved.  No definite segmental abnormalities      or contraction were identified.  2. The left main does demonstrate a little bit of tenting at the      ostium.  Multiple views were obtained, and there was good efflux of      contrast back into the aorta.  High-grade obstruction is not noted,      but the tenting would represent probably about a 30% overall      luminal reduction.  The left main itself appears to be smooth.  3. The left anterior descending artery courses to the apex.  The first      tiny diagonal branch in the LAO view does demonstrate some      narrowing, probably of 90%, but this is a 1-mm vessel or less.  The  second diagonal branch is a large-caliber vessel free of critical      disease in the mid and distal LAD.  All appear free of significant      high-grade disease.  4. The ramus intermedius is a moderate-size vessel with minimal      proximal luminal irregularity.  5. The circumflex coronary artery provides 2 marginal branches and has      no significant obstruction.  6. The right coronary artery also was minimally tapered at the ostium.      The right coronary, however, is smooth throughout providing a      posterior descending and posterolateral branch with minimal or mild      plaquing at the origin of the PDA, which is not hemodynamically      significant.   CONCLUSIONS:  1. Well-preserved left ventricular function.  2. A 30% ostial narrowing of the left main that does not appear to be      high-grade or flow-limiting.  3. Small first diagonal branch with abnormality noted in the LAO      cranial view.  4. Other findings as noted above.   DISPOSITION:   1. We will see the patient back in the office, and a medical therapy      program will be recommended.  2. Repeat exercise testing in 6 months will be suggested.  3. We will likely obtain an outpatient echocardiogram to evaluate the      patient's borderline gradient on pullback across the aortic valve.      [name]. Lia Foyer, MD, [facility]  Electronically Signed     TDS/MEDQ  D:  09/20/2007  T:  09/20/2007  Job:  [id number]   cc:   Nelda Severe. Juventino Slovak, M.D.  [facility]

## 2010-06-08 NOTE — Assessment & Plan Note (Signed)
[facility] OFFICE NOTE   Donaven, Criswell SHUN PLETZ                        MRN:          [id number]  DATE:10/09/2007                            DOB:          05/15/1940    Jeffery [name] is in for a followup visit.  We recently had him come in for  cardiac catheterization.  According to his family, he has been under a  fair amount of job-related stress with the economy.  He is a Diplomatic Services operational officer.  [name] had had a radionuclide imaging study done in May of  this year.  He exercised for 8 minutes and 15 seconds without chest pain  and had borderline changes.  There was mild decrease in inferior wall  that was thought to be due to diaphragmatic attenuation with an ejection  fraction of 64%.  He continues to have a little bit of tightness and  some shortness of breath at times, although it has not progressed.  He  is starting to exercise in a more organized fashion, typically doing it  in the morning.  At diagnostic catheterization, his overall LV systolic  function appeared to be preserved.  There is some tenting of the left  main ostium, but there did not appear to be more than about 30% luminal  reduction.  The first tiny diagonal branch does demonstrate narrowing of  90%, but it was a 1 mm vessel.  There was minimal irregularity at the  ramus intermedius.  No significant marginal disease and no significant  RCA disease.  Overall, there was a small gradient on pullback across the  aortic valve.   PHYSICAL EXAMINATION:  VITAL SIGNS:  The blood pressure is 124/82.  The  pulse is 64.  LUNGS:  Fields are clear.  CARDIAC:  Rhythm is regular.  Careful examination of the precordium did  not reveal significant murmur.  His groin is well healed.   His recent chest x-ray revealed probable chronic slight biapical pleural  thickening, but with heart size and mediastinal contours within normal  limits.  Topographically, clear lung  fields.   In general, there is no obvious active disease.   We will continue to follow [name] fairly closely.  We will get a 2-D  echocardiogram because of the valvular gradient on pullback.  I doubt  that we will find much with this.  We will also see him in follow up in  3 months.     [name]. Lia Foyer, MD, [facility]  Electronically Signed    TDS/MedQ  DD: 10/09/2007  DT: 10/09/2007  Job #: [id number]   cc:   Nelda Severe. Juventino Slovak, M.D.

## 2010-06-08 NOTE — Letter (Signed)
Jun 19, 2007    Nelda Severe. Juventino Slovak, M.D.  [address] [place], [place] [address]   RE:  Jeffery [name], Jeffery [name]  MRN:  [id number]  /  DOB:  1940-12-21   Dear Clair Gulling:   I had the pleasure of seeing Jesiel Garate in the office today in  followup.  In general, he is about the same.  He underwent an exercise  Myoview study.  To basically summarize, this did not demonstrate  significant perfusion deficit.  There was with 8 minutes in 15 seconds  of exercise, some minor nonspecific ST abnormalities, and there was a  mild decrease in activity in the inferior wall which was the same with  stress and thought to not represent significant ischemia but probably  diaphragmatic attenuation.  He had fairly good exercise capacity and a  normal blood pressure response.  His overall ejection fraction was 64%.  He and I went carefully through the images today, and I explained to him  the results of the findings.  He personally thinks that some of this may  be due to lack of activity and exercise.   PHYSICAL EXAMINATION:  VITAL SIGNS:  Today, his blood pressure is  120/80, and the pulse is 72.  LUNGS:  The lung fields are clear.  CARDIAC:  The cardiac rhythm is regular.   He and I have had a thorough discussion.  He does not have a high-risk  scan.  As I pointed out to him, it does not necessarily mean that he  does not have underlying coronary disease.  It simply represents a non  high-risk scan.  With this, we both agreed that he would begin an  exercise program.  I have recommended exercise 3-4 times a week for 45  minutes to an hour.  We thought that he would best start with walking.  We will reassess his status in about 6 weeks to see how he is feeling to  see if  has improved.  I told him that we have not set in stone  definitive plans, but this would be helpful.  He also has had a  tremendous amount of difficulty  taking statins, and although his LDL is modestly elevated on Zetia, it  seems  like the best strategy for now.   Thanks so much for allowing me to share in his care.  I will send you a  note when I see him back to just reevaluate how he has done.    Sincerely,      [name]. Lia Foyer, MD, [facility]  Electronically Signed    TDS/MedQ  DD: 06/19/2007  DT: 06/19/2007  Job #: [id number]

## 2010-06-08 NOTE — Letter (Signed)
May 29, 2007    Nelda Severe. Juventino Slovak, M.D.  [address], [place], [place] [address]   RE:  Jeffery [name], Jeffery [name]  MRN:  [id number]  /  DOB:  1940/08/07   Dear Clair Gulling:   Thanks so much for asking me to see Jeffery [name] in cardiac consultation.  As you know, he has had some vague discomfort in his chest associated  with some shortness of breath.  His overall exercise tolerance has  declined.  The patient has been under some stress at work, but he does  not feel that this has been necessarily contributory to his symptoms.   I have sent a copy of my note.  His exam was unremarkable, and EKG which  you provided also was unremarkable.  We have recommended that he undergo  an exercise Myoview stress testing as a screening tool for possible  coronary artery disease given his symptoms.  One notable feature is a  strong family history of coronary disease in his father and his father's  multiple siblings.   Thanks so much for allowing [place] to share in his care, and I will be back  in touch with you regarding the results of his study.    Sincerely,      [name]. Lia Foyer, MD, [facility]  Electronically Signed    TDS/MedQ  DD: 05/29/2007  DT: 05/29/2007  Job #: [id number]

## 2010-06-08 NOTE — Assessment & Plan Note (Signed)
[facility] OFFICE NOTE   [name], Jeffery [name]                        MRN:          [id number]  DATE:02/05/2008                            DOB:          03/10/1940    Jeffery [name] is in for followup.  He is really doing well.  He has minimal  if any chest discomfort.  His blood pressure has been improved at home  being in the 130 range.  He denies current chest pain.   He has come off aspirin in the past because of his Crohn's; however, it  really never bothered him all that much.  However, that is the medical  reason he was not on aspirin.   MEDICATIONS:  1. Zetia 10 mg daily.  2. Sulfasalazine 2 tablets t.i.d.  3. Folic acid 1 mg daily.  4. Multivitamin daily.  5. Osteo Bi-Flex 2 daily.  6. Amlodipine 5 mg daily.   PHYSICAL EXAMINATION:  The blood pressure was 143/84, on repeat by me is  130/80, pulse 76.  Lung fields clear.  Cardiac rhythm is regular.  Extremities revealed no edema.   Overall, the patient continues to do well.  We did not do an EKG today,  just one has been done 6 weeks earlier.   IMPRESSION:  1. Class I angina pectoris.  2. Coronary artery disease as described in the previous      catheterization.  3. Hypertension under improved control.   PLAN:  1. Return to clinic in 4 months.  2. Continue to follow up with his primary care physician.     [name]. Lia Foyer, MD, [facility]  Electronically Signed    TDS/MedQ  DD: 02/05/2008  DT: 02/05/2008  Job #: [id number]   cc:   Nelda Severe. Juventino Slovak, M.D.

## 2010-06-08 NOTE — Assessment & Plan Note (Signed)
Jeffery [name], Jeffery [name]                        MRN:          [id number]  DATE:05/29/2007                            DOB:          1940-04-14    CHIEF COMPLAINT:  Shortness of breath.   HISTORY OF PRESENT ILLNESS:  Jeffery [name] is a very delightful 70 year old  who works as a Public house manager for a Therapist, art.  He has been  followed by Dr. [name] in the GI clinic and is a patient of Dr.  Juventino Slovak.  His chief complaint has been that he has had some shortness of  breath and mild chest tightness.  Most of the time it occurs with  exertion.  Notably, this has come on slowly over a bit of time.  He has  never had nighttime or nocturnal symptoms.  He denies any progressive  shortness of breath in the past few weeks or symptoms at rest.  There is  no obvious radiation other than just a mild central tightness.  He is  now here for further evaluation.   PAST MEDICAL HISTORY:  1. Long history of Crohn's disease, has never had surgery.  2. The patient had surgery on his left breast many years ago which      turned out to be a benign tumor.  3. He had two prostate surgeries 10 years ago.  4. He has had eye surgery in the right eye.  5. He had heart catheterization 20 years ago by Dr. Claiborne Billings in 1991      which was normal.  6. He had a double hernia repair 20 years ago as well.   ALLERGIES:  He has no known drug allergies.   MEDICATIONS:  1. Zetia 10 mg daily.  2. Sulfasalazine 500 mg 2 tablets 3 times a day.  3. Folic acid 1 mg daily.  4. Multivitamin daily.  5 . Osteobiflex 2 daily.   FAMILY HISTORY:  Mother is alive at 20, has hypertension.  Father died  at 44 of a heart attack.  He has one sister in good health at 7.   SOCIAL HISTORY:  The patient is a nonsmoker.  He quit about 1980-1985.  He works as a Public house manager for a Therapist, art.   PHYSICAL EXAMINATION:  GENERAL:   He is alert and oriented in no acute  distress.  VITAL SIGNS:  Blood pressure is 130/90, equal in both arms.  The pulse  was 76.  HEENT:  Examination is grossly unremarkable.  NECK:  Supple.  Thyroid appears to be normal.  The lung fields are clear  to auscultation and percussion.  The carotid upstrokes are brisk.  CARDIAC:  Exam reveals nondisplaced PMI.  There are normal first and  second heart sounds without murmurs, rubs, or gallops.  No diastolic  murmurs are noted.  ABDOMEN:  Soft without obvious hepatosplenomegaly.  EXTREMITIES:  Reveal no edema.  NEUROLOGIC:  Exam was nonfocal.   Review of the electrocardiogram demonstrates normal sinus rhythm  essentially within normal limits.   IMPRESSION:  1.  Exertional dyspnea with very mild chest tightness which is      gradually progressive.  2. Hypercholesterolemia with history of statin  intolerance.  3. History of Crohn's disease.  4. Positive family history.   RECOMMENDATIONS:  I have discussed the findings with the patient.  He  does not have significant physical findings.  His lung fields are clear,  and his cardiac rhythm is normal.  He does have some shortness of  breath, but does note a definite change in pattern over time which has  been gradually and slowly progressive. Based on this, it would be our  impression that he would be best served by exercise Myoview imaging.  We  plan to get this information and have the patient back in followup.     [name]. Lia Foyer, MD, [facility]  Electronically Signed    TDS/MedQ  DD: 05/29/2007  DT: 05/29/2007  Job #: [id number]   cc:   Nelda Severe. Juventino Slovak, M.D.

## 2010-06-08 NOTE — Assessment & Plan Note (Signed)
Jeffery [name]   Jeffery [name], Jeffery [name]                        MRN:          [id number]  DATE:02/12/2007                            DOB:          1940/12/13    The patient is a 70 year old gentleman with Crohn's disease since 1986,  last colonoscopy in December of 2007 with banding of the hemorrhoids.  There was no evidence of active Crohn's disease in the colon or  ileocecal valve or terminal ileum.  He comes for one-year checkup.  The  patient remains asymptomatic.  There is occasional left middle quadrant  abdominal pain which he has complained of intermittently for the past 20  years, but on colonoscopy does not show any evidence of Crohn's disease.  His bowel habits are regular.  He denies diarrhea or rectal bleeding.  The banded hemorrhoids seem to be doing well.  He has occasional rectal  irritation which he treats with topical steroids.   There is also a history of fistula in ano in 1993, history of colon  polyps on colonoscopy in 1994.  He has nonspecific esophageal  dysmotility demonstrated on a barium swallow in 1996 after he complained  of dysphagia.  Pepcid AC seemed to help indicating that the problems may  be precipitated by intermittent gastroesophageal reflux disease.  Upper  endoscopy in 2000 showed grade 1 chronic esophagitis and negative  CLOtest.  The patient had a TURP in February of 1999 by Jeffery [name],  M.D.  His most recent PSA was normal.  The patient also had a complete  physical examination by Dr. Juventino [name] with all blood tests including CBC,  CMET, and PSA which were normal.   MEDICATIONS:  1. Azulfidine 1 gram p.o. b.i.d. sometimes t.i.d.  2. Folic acid 1 mg p.o. daily.  3. Pepcid AC p.r.n.  4. Osteo-BiFlex daily.  5. Centrum Silver.  6. Zetia 10 mg p.o. daily.  7. Folic acid.   PHYSICAL EXAMINATION:  VITAL SIGNS:  Blood pressure 122/80, [name] 60,  and weight 193  pounds.  GENERAL:  The patient was alert and oriented in no distress.  LUNGS:  Clear to auscultation.  HEART:  Normal S1 and S2.  ABDOMEN:  Soft and nontender with minimal discomfort in the left lower  and left middle quadrant, no distention, and normoactive bowel sounds.  Liver edge at the costal margin.  In the right lower quadrant there were  indurations in the abdominal wall consistent with either lipomas or  fibromas.  RECTAL:  Normal rectal tone, normal prostate.  Stool is hemoccult  negative.   IMPRESSION:  66. A 70 year old white male with stable Crohn's disease since 46.      He is up-to-date on his colonoscopy as of December of 2007.  2. Status post banding of the hemorrhoids.  3. Mild esophageal dysmotility as per barium swallow in 1997.  4. Colon polyp in 1999 and 1994.  5. Hyperlipidemia.  6. Status post Transurethral prostatic resection.  7. Right eye macular problems being followed by ophthalmologist.   PLAN:  1. Refill Azulfidine and folic acid.  2. Carbohydrate modified diet to lower his LDL.  I will see him again      in one year.     Jeffery [name]. Jeffery Perches, MD  Electronically Signed    DMB/MedQ  DD: 02/12/2007  DT: 02/12/2007  Job #: [id number]   cc:   Jeffery [name]. Jeffery [name], M.D.

## 2010-06-08 NOTE — Assessment & Plan Note (Signed)
[place] OFFICE NOTE   Shrihan, Putt JD MCCASTER                        MRN:          [id number]  DATE:12/26/2007                            DOB:          Jan 19, 1941    Mr. Fiumara is in for followup.  He still gets a little bit of tightness  in the chest.  It occurs only at the initial exercise, but the end of  exercise is not all that bad.  He denies progressive chest pain and his  symptoms had been well controlled.  Unfortunately, Dr. Delilah Shan is going  to a [facility]-type practice, and he wants to be referred to a [facility].  I gave him a location and familiarity with his  location.   MEDICATIONS:  1. Zetia 10 mg daily.  2. Sulfasalazine 2 tablets t.i.d.  3. Folic acid 1 mg daily.  4. Multivitamin one daily.  5. Osteo Bi-Flex 2 daily.   PHYSICAL EXAMINATION:  GENERAL:  He is alert and oriented in no  distress.  VITAL SIGNS:  Blood pressure was 150/92, pulse 62.  LUNG:  Fields are clear.  CARDIAC:  Rhythm is regular.   Electrocardiogram demonstrates normal sinus rhythm within normal limits.   IMPRESSION:  1. Class I angina pectoris.  2. Underlying coronary disease as previously described in the most      recent catheterization procedure.   DISPOSITION:  1. Add amlodipine 5 mg daily.  2. Discussion regarding Zetia.  3. Return to clinic in 6 weeks.     [name]. Lia Foyer, MD, [facility]  Electronically Signed    TDS/MedQ  DD: 12/26/2007  DT: 12/26/2007  Job #: [id number]

## 2010-06-08 NOTE — Assessment & Plan Note (Signed)
[place] OFFICE NOTE   Jeffery [name], Jeffery [name]                        MRN:          [id number]  DATE:08/16/2007                            DOB:          1940/03/08    Jeffery [name] is in for followup.  Since I last saw him, he has gotten out  and started to walk more.  He is walking about 3-4 times a week for  about 45 minutes.  In the first few minutes of exercise, he starts to  get discomfort in the chest.  This discomfort in the chest is consistent  with some moderate tightness which then gradually eases off.  His  radionuclide imaging study was read as a probable low-risk study.  The  patient underwent previous cardiac catheterization in 1991, and at that  time, he had no significant coronary obstruction.  The current study was  associated with 8 minutes and 15 seconds of exercise with fatigue and no  chest pain.  There were some mild ST-segment changes, but no definite  perfusion defect except for decrease in activity in the inferior wall  that was thought to be possibly diaphragmatic attenuation, although some  ischemia cannot be entirely excluded.  In the short axis views, there is  some possible mild late redistribution.  The patient has not had any  pain at rest.  Most of the time, this occurs over with exertion.   His past medical history is remarkable for a long history of Crohn  disease.  He had breast surgery many years ago.  He has had two prostate  surgeries.  He has had eye surgery.  He has had double hernia repair.   He has no known allergies.   His medications include Zetia 10 mg daily, sulfasalazine 500 mg 2  tablets t.i.d., folic acid daily, multivitamin daily, and Osteo Bi-Flex  two daily.   His mother is still alive, but his father did die of a cardiac event.  He has a sister who is in good health.   He quit smoking in 1980-1985.  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   On  his examination, he is alert and oriented in no distress.  The blood  pressure is 150/90, the pulse is 59.  Jugular veins not distended.  The  lung fields are clear.  The cardiac rhythm is regular.  The extremities  reveal no edema.  Distal pulses are intact.  Neurologic exam is  completely nonfocal.   EKG today reveals sinus bradycardia, otherwise within normal limits.   IMPRESSION:  1. Continued exertional dyspnea with very mild chest tightness and      fatigue.  2. Hypercholesterolemia with a history of statin intolerance.  3. History of Crohn disease.  4. Positive family history for coronary artery disease.  5. History of prior prostate surgery.  6. History of eye surgery.   PLAN:  I have discussed the case with the patient in detail.  Given the  fact that he continues to have discomfort with exertion even with a  fairly  low risk Myoview, it is difficult to be certain that he does not  have coronary disease.  We plan to get his laboratory studies, and  cardiac catheterization has been recommended.  Risks, benefits, and  alternatives have been discussed and he has agreed to proceed.  This  will be arranged in about 10 days, as he has plans next week.     [name]. Lia Foyer, MD, [facility]  Electronically Signed    TDS/MedQ  DD: 08/16/2007  DT: 08/17/2007  Job #: [id number]   cc:   Nelda Severe. Juventino Slovak, M.D.

## 2010-06-11 NOTE — Assessment & Plan Note (Signed)
[place] OFFICE NOTE   Jeffery [name], Jeffery [name]                        MRN:          [id number]  DATE:12/21/2005                            DOB:          02/12/1940    Jeffery [name] is a very nice 70 year old white male who has Crohn's disease  of the terminal ileum, status post fistula in[place] in 1993.  He also had  a colon polyp in 1999.  Last colonoscopy  was done in August 2002.  He  now comes with intermittent rectal bleeding which is bright red blood,  last episode about 2 weeks ago, soaked his underwear.  We have sent him  over-the-counter Anusol suppositories.  Bleeding has improved, but he  still feels some fullness in the rectum.   MEDICATIONS:  1. Azulfidine 500 mg, 2 q.d.  2. Folic acid 1 mg p.o. q.d.  3. Zetia 10 mg p.o. q.d.  4. Centrum Silver.  5. Multivitamins.   PHYSICAL EXAMINATION:  VITAL SIGNS:  Blood pressure 120/80, pulse 76,  weight 190 pounds.  GENERAL:  He was alert, oriented, in no distress.  LUNGS:  Clear to auscultation.  COR:  Normal S1, normal S2.  ABDOMEN:  Soft, nontender.  RECTAL:  Anoscopic exam shows normal perianal area, normal rectal tone,  2 large internal hemorrhoids which were cyanotic, full of blood, but no  active bleeding, no fissure or no pain.  Hemorrhoids did not collapse.  They were consistent with grade I internal hemorrhoids.  Two of them  were present.  No evidence of colitis.  Stool was heme-occult negative.   IMPRESSION:  1. Symptomatic first grade hemorrhoids causing intermittent rectal      bleeding.  2. Patient with Crohn's disease of the colon status post remote      history of anal fissure.  Currently no evidence of anal fissure.   PLAN:  1. Continue Anusol HC suppositories.  2. Patient to be scheduled for banding of his hemorrhoids.  He has      also due for a colonoscopy since last one was done more than 5      years ago.  We will schedule with a  routine colonoscopy  prep and      hemorrhoidal banding at the same time.     Jeffery [name]. Olevia Perches, MD  Electronically Signed    DMB/MedQ  DD: 12/21/2005  DT: 12/22/2005  Job #: [id number]

## 2010-06-17 ENCOUNTER — Ambulatory Visit (INDEPENDENT_AMBULATORY_CARE_PROVIDER_SITE_OTHER): Payer: Medicare Other | Admitting: Internal Medicine

## 2010-06-17 ENCOUNTER — Other Ambulatory Visit (INDEPENDENT_AMBULATORY_CARE_PROVIDER_SITE_OTHER): Payer: Medicare Other

## 2010-06-17 ENCOUNTER — Encounter: Payer: Self-pay | Admitting: Internal Medicine

## 2010-06-17 VITALS — BP 126/80 | HR 68 | Ht 73.0 in | Wt 187.0 lb

## 2010-06-17 DIAGNOSIS — R6889 Other general symptoms and signs: Secondary | ICD-10-CM

## 2010-06-17 DIAGNOSIS — R739 Hyperglycemia, unspecified: Secondary | ICD-10-CM

## 2010-06-17 DIAGNOSIS — K5000 Crohn's disease of small intestine without complications: Secondary | ICD-10-CM

## 2010-06-17 DIAGNOSIS — E782 Mixed hyperlipidemia: Secondary | ICD-10-CM

## 2010-06-17 DIAGNOSIS — K219 Gastro-esophageal reflux disease without esophagitis: Secondary | ICD-10-CM

## 2010-06-17 DIAGNOSIS — R7309 Other abnormal glucose: Secondary | ICD-10-CM

## 2010-06-17 LAB — COMPREHENSIVE METABOLIC PANEL
AST: 24 U/L (ref 0–37)
Albumin: 3.9 g/dL (ref 3.5–5.2)
Alkaline Phosphatase: 50 U/L (ref 39–117)
Potassium: 3.7 mEq/L (ref 3.5–5.1)
Sodium: 137 mEq/L (ref 135–145)
Total Protein: 6.2 g/dL (ref 6.0–8.3)

## 2010-06-17 LAB — CBC WITH DIFFERENTIAL/PLATELET
Basophils Relative: 0.4 % (ref 0.0–3.0)
Eosinophils Relative: 1.1 % (ref 0.0–5.0)
HCT: 39.4 % (ref 39.0–52.0)
Lymphs Abs: 1.6 10*3/uL (ref 0.7–4.0)
MCV: 92.6 fl (ref 78.0–100.0)
Monocytes Absolute: 0.6 10*3/uL (ref 0.1–1.0)
Platelets: 208 10*3/uL (ref 150.0–400.0)

## 2010-06-17 LAB — LIPID PANEL
Cholesterol: 134 mg/dL (ref 0–200)
LDL Cholesterol: 74 mg/dL (ref 0–99)
Total CHOL/HDL Ratio: 3

## 2010-06-17 MED ORDER — SULFASALAZINE 500 MG PO TABS: ORAL_TABLET | ORAL | Status: DC

## 2010-06-17 MED ORDER — FOLIC ACID 1 MG PO TABS: 1.0000 mg | ORAL_TABLET | Freq: Every day | ORAL | Status: DC

## 2010-06-17 NOTE — Patient Instructions (Signed)
We have sent a prescription for Azulfadine for you to take 2 tablets by mouth twice daily. We have sent a prescription for Folic Acid for you to take 1 tablet daily. Your physician has requested that you go to the basement for the following lab work before leaving today: CBC, Lipid Profile, TSH, CMET CC: Dr Frederico Hamman Copland

## 2010-06-17 NOTE — Progress Notes (Signed)
Jeffery [name] 12-Jan-1941 MRN [id number]      History of Present Illness:  This is a 70 year old white male with Crohn's disease since 4. This is his yearly checkup. He has a history of an anal fissure in 1993. He has been in remission on sulfasalazine 1 g twice a day and folic acid 1 mg daily. His last colonoscopy in December 2007 showed a normal terminal ileum and normal ileal biopsies. He underwent a hemorrhoidal band ligation at that time. A prior colonoscopy in 1999 showed polyps. He had a normal colon in 2002. He has a history of  gastroesophageal reflux. His last endoscopy in 2000 showed grade 1 esophagitis. He had esophageal dysmotility on a barium swallow in 1996.   Past Medical History  Diagnosis Date  . Anal fistula   . History of esophagitis   . Internal hemorrhoids   . Hyperplastic colon polyp   . Esophageal motility disorder   . CAD (coronary artery disease)   . Crohn disease   . Hyperlipidemia   . Hypertension   . Hearing loss   . History of BPH    Past Surgical History  Procedure Date  . Eye surgery     right eye  . Hernia repair     x 2  . Lipoma excision     chest  . Prostate surgery     x 2, TURP    reports that he has quit smoking. He does not have any smokeless tobacco history on file. He reports that he drinks alcohol. He reports that he does not use illicit drugs. family history includes Colon polyps in his mother.  There is no history of Colon cancer. Allergies  Allergen Reactions  . Ace Inhibitors     REACTION: cough        Review of Systems: Denies abdominal pain, dysphagia or odynophagia. Has occasional diarrhea, occasional rectal bleeding secondary to hemorrhoids  The remainder of the 10  point ROS is negative except as outlined in H&P   Physical Exam: General appearance  Well developed, in no distress. Eyes- non icteric. HEENT nontraumatic, normocephalic. Mouth no lesions, tongue papillated, no cheilosis. Neck supple without  adenopathy, thyroid not enlarged, no carotid bruits, no JVD. Lungs Clear to auscultation bilaterally. Cor normal S1 normal S2, regular rhythm , no murmur,  quiet precordium. Abdomen soft scaphoid abdomen with normal active bowel sounds. No tenderness. Liver edge at costal margin. Multiple lipomas in upper abdomen small hemorrhoidal tag externally. Rectal: Normal rectal sphincter tone. Normal prostate. Stool is Hemoccult negative. Extremities no pedal edema. Skin no lesions. Neurological alert and oriented x 3. Psychological normal mood and affect.  Assessment and Plan:  Problem #1 Crohn's disease. Asymptomatic. Patient is stable on Azulfidine and folic acid. He will continue the same regimen. He is due for a colonoscopy in December 2012. He will have routine labs today.  Problem #2 Hhemorrhoids. Patient is status post band ligation in 2007. He is currently asymptomatic. He is using over-the-counter preparations.  Problem #3 gastroesophageal reflux. He is asymptomatic at this time. He takes PPIs on an as necessary basis.   06/17/2010 Jeffery [name]

## 2010-06-18 ENCOUNTER — Telehealth: Payer: Self-pay | Admitting: *Deleted

## 2010-06-18 NOTE — Telephone Encounter (Signed)
Message copied by Hulan Saas on Fri Jun 18, 2010  9:15 AM ------      Message from: [place] Dragon      Created: Thu Jun 17, 2010 10:28 PM       Please call pt with results. Although his blood sugar was slightly elevated, his Hgb A1C is normal. No evidence of Diabetes.

## 2010-06-18 NOTE — Telephone Encounter (Signed)
Patient notified of results as per Dr. Brodie. 

## 2010-06-24 ENCOUNTER — Encounter: Payer: Self-pay | Admitting: Cardiology

## 2010-06-24 ENCOUNTER — Ambulatory Visit (INDEPENDENT_AMBULATORY_CARE_PROVIDER_SITE_OTHER): Payer: Medicare Other | Admitting: Cardiology

## 2010-06-24 DIAGNOSIS — E7800 Pure hypercholesterolemia, unspecified: Secondary | ICD-10-CM

## 2010-06-24 DIAGNOSIS — I2510 Atherosclerotic heart disease of native coronary artery without angina pectoris: Secondary | ICD-10-CM

## 2010-06-24 DIAGNOSIS — I10 Essential (primary) hypertension: Secondary | ICD-10-CM

## 2010-06-24 LAB — BASIC METABOLIC PANEL
CO2: 30 mEq/L (ref 19–32)
Calcium: 9.6 mg/dL (ref 8.4–10.5)
Creatinine, Ser: 1.3 mg/dL (ref 0.4–1.5)
Glucose, Bld: 99 mg/dL (ref 70–99)

## 2010-06-24 NOTE — Assessment & Plan Note (Signed)
Under treatment at present.

## 2010-06-24 NOTE — Assessment & Plan Note (Signed)
Currently without any symptoms. Cath showed mainly small diagonal.  Has new change in ECG from before, totally asymptomatic.  Will suggest rest stress myoview, check electrolytes.  Discussed with patient who agrees with this plan.

## 2010-06-24 NOTE — Assessment & Plan Note (Signed)
Has lost weight. BP is down.  Will reduce amlodipine to 36m daily, and check lytes.  Follow up with Dr. CLorelei Pont

## 2010-06-24 NOTE — Progress Notes (Signed)
HPI:  He is doing fine.  He retired, and has lost weight.  He feels good.  He is headed to the beach over the next few weeks.  Denies any symptoms at all.    Current Outpatient Prescriptions  Medication Sig Dispense Refill  . amLODipine (NORVASC) 10 MG tablet Take 10 mg by mouth daily.        Marland Kitchen atorvastatin (LIPITOR) 20 MG tablet Take 20 mg by mouth daily.        . fluticasone (FLONASE) 50 MCG/ACT nasal spray 2 sprays by Nasal route daily.        . folic acid (FOLVITE) 1 MG tablet Take 1 tablet (1 mg total) by mouth daily.  100 tablet  1  . Glucosamine-Chondroitin 250-200 MG TABS Take 1 tablet by mouth daily.        . hydrochlorothiazide (,MICROZIDE/HYDRODIURIL,) 12.5 MG capsule Take 12.5 mg by mouth daily.        Marland Kitchen latanoprost (XALATAN) 0.005 % ophthalmic solution Place 1 drop into both eyes at bedtime.        Marland Kitchen losartan (COZAAR) 100 MG tablet Take 100 mg by mouth daily.        . Multiple Vitamins-Minerals (CENTRUM PO) Take 1 tablet by mouth daily.        Marland Kitchen omeprazole (PRILOSEC) 20 MG capsule TAKE ONE CAPSULE BY MOUTH EVERY DAY  30 capsule  0  . sulfaSALAzine (AZULFIDINE) 500 MG tablet Take 2 tablets by mouth twice daily  360 tablet  1    Allergies  Allergen Reactions  . Ace Inhibitors     REACTION: cough    Past Medical History  Diagnosis Date  . Anal fistula   . History of esophagitis   . Internal hemorrhoids   . Hyperplastic colon polyp   . Esophageal motility disorder   . CAD (coronary artery disease)   . Crohn disease   . Hyperlipidemia   . Hypertension   . Hearing loss   . History of BPH     Past Surgical History  Procedure Date  . Eye surgery     right eye  . Hernia repair     x 2  . Lipoma excision     chest  . Prostate surgery     x 2, TURP    Family History  Problem Relation Age of Onset  . Colon polyps Mother   . Colon cancer Neg Hx     History   Social History  . Marital Status: Married    Spouse Name: N/A    Number of Children: 2  . Years of  Education: N/A   Occupational History  .     Social History Main Topics  . Smoking status: Former Research scientist (life sciences)  . Smokeless tobacco: Not on file  . Alcohol Use: Yes     occasional  . Drug Use: No  . Sexually Active: Not on file   Other Topics Concern  . Not on file   Social History Narrative   Daily caffeine    ROS: Please see the HPI.  All other systems reviewed and negative.  PHYSICAL EXAM:  BP 97/60  Pulse 67  Resp 18  Ht 6' (1.829 m)  Wt 185 lb (83.915 kg)  BMI 25.09 kg/m2  General: Well developed, well nourished, in no acute distress. Head:  Normocephalic and atraumatic. Neck: no JVD Lungs: Clear to auscultation and percussion. Heart: Normal S1 and S2.  No murmur, rubs or gallops.  Abdomen:  Normal  bowel sounds; soft; non tender; no organomegaly Pulses: Pulses normal in all 4 extremities. Extremities: No clubbing or cyanosis. No edema. Neurologic: Alert and oriented x 3.  EKG:  NSR. T wave inversion in 1, AVL.  Possible lateral ischemia.  This is new from prior studies of 2011, 2010.  Reviewed with patient.  ASSESSMENT AND PLAN:

## 2010-06-24 NOTE — Patient Instructions (Addendum)
Your physician has requested that you have en exercise stress myoview. For further information please visit [email or web address]. Please follow instruction sheet, as given. [phone]  [phone]  Will call you with results of test  Your physician recommends that you schedule a follow-up appointment in: 2 months with Dr Lia Foyer   Your physician recommends that you return for lab work today BMP  [phone]

## 2010-06-30 ENCOUNTER — Encounter: Payer: Self-pay | Admitting: *Deleted

## 2010-07-05 ENCOUNTER — Telehealth: Payer: Self-pay | Admitting: Cardiology

## 2010-07-05 NOTE — Telephone Encounter (Signed)
Pt aware of 06/24/10 BMP result.

## 2010-07-05 NOTE — Telephone Encounter (Signed)
Test results

## 2010-07-20 ENCOUNTER — Ambulatory Visit (HOSPITAL_COMMUNITY): Payer: Medicare Other | Attending: Cardiology | Admitting: Radiology

## 2010-07-20 DIAGNOSIS — R0609 Other forms of dyspnea: Secondary | ICD-10-CM

## 2010-07-20 DIAGNOSIS — R9431 Abnormal electrocardiogram [ECG] [EKG]: Secondary | ICD-10-CM

## 2010-07-20 DIAGNOSIS — E7800 Pure hypercholesterolemia, unspecified: Secondary | ICD-10-CM

## 2010-07-20 DIAGNOSIS — I10 Essential (primary) hypertension: Secondary | ICD-10-CM | POA: Insufficient documentation

## 2010-07-20 DIAGNOSIS — I2510 Atherosclerotic heart disease of native coronary artery without angina pectoris: Secondary | ICD-10-CM | POA: Insufficient documentation

## 2010-07-20 MED ORDER — TECHNETIUM TC 99M TETROFOSMIN IV KIT
11.0000 | PACK | Freq: Once | INTRAVENOUS | Status: AC | PRN
  Administered 2010-07-20: 11 via INTRAVENOUS

## 2010-07-20 MED ORDER — TECHNETIUM TC 99M TETROFOSMIN IV KIT
33.0000 | PACK | Freq: Once | INTRAVENOUS | Status: AC | PRN
  Administered 2010-07-20: 33 via INTRAVENOUS

## 2010-07-20 NOTE — Progress Notes (Signed)
[place] [address] [phone]  Cardiology Nuclear Med Study  Jeffery [name] is a 70 y.o. male [id number] 10/06/1940   Nuclear Med Background Indication for Stress Test:  Evaluation for Ischemia and Abnormal EKG History: 9/09 Echo: EF 60%, 08/09 Heart Catheterization: mild N/O plaque EF NL and 05/09 Myocardial Perfusion Study: (-) ischemia EF 64% Cardiac Risk Factors: Family History - CAD, History of Smoking, Hypertension and Lipids  Symptoms:  DOE   Nuclear Pre-Procedure Caffeine/Decaff Intake:  None NPO After: 7:00pm   Lungs:  clear IV 0.9% NS with Angio Cath:  20g  IV Site: R Hand  IV Started by:  Matilde Haymaker, RN  Chest Size (in):  42 Cup Size: n/a  Height: 6' (1.829 m)  Weight:  183 lb (83.008 kg)  BMI:  Body mass index is 24.82 kg/(m^2). Tech Comments:  n/a    Nuclear Med Study 1 or 2 day study: 1 day  Stress Test Type:  Stress  Reading MD: Mertie Moores, MD  Order Authorizing Provider:  T.Stuckey  Resting Radionuclide: Technetium 74mTetrofosmin  Resting Radionuclide Dose: 11.0 mCi   Stress Radionuclide:  Technetium 930metrofosmin  Stress Radionuclide Dose: 33.0 mCi           Stress Protocol Rest HR: 60 Stress HR: 134  Rest BP: 147/100 Stress BP: 194/93  Exercise Time (min): 9:09 METS: 10.3   Predicted Max HR: 151 bpm % Max HR: 88.74 bpm Rate Pressure Product: 25996   Dose of Adenosine (mg):  n/a Dose of Lexiscan: n/a mg  Dose of Atropine (mg): n/a Dose of Dobutamine: n/a mcg/kg/min (at max HR)  Stress Test Technologist: SaPerrin MalteseEMT-P  Nuclear Technologist:  StCharlton AmorCNMT     Rest Procedure:  Myocardial perfusion imaging was performed at rest 45 minutes following the intravenous administration of Technetium 9965mtrofosmin. Rest ECG: Sinus Bradycardia  Stress Procedure:  The patient exercised for 9:09.  The patient stopped due to fatigue, sob, and denied any chest  pain.  There were no significant ST-T wave changes.  Technetium 47m64mrofosmin was injected at peak exercise and myocardial perfusion imaging was performed after a brief delay. Stress ECG: No significant change from baseline ECG  QPS Raw Data Images:  There is no interference from nuclear activity from structures below the diaphragm.  This does not affect the ability to read the study. Stress Images:  There is mild apical thinning with normal uptake in the other regions. Rest Images:  There is mild apical thinning with normal uptake in the other regions Subtraction (SDS):  No evidence of ischemia. Transient Ischemic Dilatation (Normal <1.22):  0.87 Lung/Heart Ratio (Normal <0.45):  0.26  Quantitative Gated Spect Images QGS EDV:  102 ml QGS ESV:  43 ml QGS cine images:  NL LV Function; NL Wall Motion QGS EF: 58%  Impression Exercise Capacity:  Good exercise capacity. BP Response:  Normal blood pressure response. Clinical Symptoms:  No chest pain. ECG Impression:  No significant ST segment change suggestive of ischemia. Comparison with Prior Nuclear Study: No significant change from previous study  Overall Impression:  Low risk stress nuclear study.  No evidence of ischemia.  Normal LV function.    PhilDarden Amber

## 2010-07-21 NOTE — Progress Notes (Signed)
Nuc med study routed to Dr. Lia Foyer 07/21/10 Charlton Amor

## 2010-07-27 ENCOUNTER — Telehealth: Payer: Self-pay | Admitting: Cardiology

## 2010-07-27 NOTE — Telephone Encounter (Signed)
Informed patient that Dr. Lia Foyer needs to review the stress test. He has a f/u appt. 7/30.

## 2010-07-27 NOTE — Telephone Encounter (Signed)
nuc stress test from 6-26 pt wants results

## 2010-07-30 ENCOUNTER — Other Ambulatory Visit: Payer: Self-pay | Admitting: Family Medicine

## 2010-07-30 NOTE — Telephone Encounter (Signed)
Patient needs appt before furthur refills

## 2010-08-23 ENCOUNTER — Encounter: Payer: Self-pay | Admitting: Cardiology

## 2010-08-23 ENCOUNTER — Ambulatory Visit (INDEPENDENT_AMBULATORY_CARE_PROVIDER_SITE_OTHER): Payer: Medicare Other | Admitting: Cardiology

## 2010-08-23 DIAGNOSIS — I10 Essential (primary) hypertension: Secondary | ICD-10-CM

## 2010-08-23 DIAGNOSIS — I2510 Atherosclerotic heart disease of native coronary artery without angina pectoris: Secondary | ICD-10-CM

## 2010-08-23 DIAGNOSIS — E7800 Pure hypercholesterolemia, unspecified: Secondary | ICD-10-CM

## 2010-08-23 MED ORDER — AMLODIPINE BESYLATE 5 MG PO TABS: 5.0000 mg | ORAL_TABLET | Freq: Every day | ORAL | Status: DC

## 2010-08-23 NOTE — Progress Notes (Signed)
HPI:  Feels great.  No symptoms.  We did nuclear stress because of an abnormal ECG.  He is asymptomatic and doing well.  Read by Dr. Acie Fredrickson as low risk.  Feels great.    Current Outpatient Prescriptions  Medication Sig Dispense Refill  . amLODipine (NORVASC) 10 MG tablet Take 1/2 tab daily      . aspirin 81 MG tablet Take 81 mg by mouth daily.        . folic acid (FOLVITE) 1 MG tablet Take 1 tablet (1 mg total) by mouth daily.  100 tablet  1  . Glucosamine-Chondroitin 250-200 MG TABS Take 1 tablet by mouth daily.        . hydrochlorothiazide (,MICROZIDE/HYDRODIURIL,) 12.5 MG capsule Take 12.5 mg by mouth daily.        Marland Kitchen latanoprost (XALATAN) 0.005 % ophthalmic solution Place 1 drop into both eyes at bedtime.        Marland Kitchen LIPITOR 20 MG tablet TAKE 1 TABLET BY MOUTH AT BEDTIME  90 tablet  0  . losartan (COZAAR) 100 MG tablet Take 100 mg by mouth daily.        . Multiple Vitamins-Minerals (CENTRUM PO) Take 1 tablet by mouth daily.        Marland Kitchen omeprazole (PRILOSEC) 20 MG capsule TAKE ONE CAPSULE BY MOUTH EVERY DAY  30 capsule  0  . sulfaSALAzine (AZULFIDINE) 500 MG tablet Take 2 tablets by mouth twice daily  360 tablet  1    Allergies  Allergen Reactions  . Ace Inhibitors     REACTION: cough    Past Medical History  Diagnosis Date  . Anal fistula   . History of esophagitis   . Internal hemorrhoids   . Hyperplastic colon polyp   . Esophageal motility disorder   . CAD (coronary artery disease)   . Crohn disease   . Hyperlipidemia   . Hypertension   . Hearing loss   . History of BPH     Past Surgical History  Procedure Date  . Eye surgery     right eye  . Hernia repair     x 2  . Lipoma excision     chest  . Prostate surgery     x 2, TURP    Family History  Problem Relation Age of Onset  . Colon polyps Mother   . Colon cancer Neg Hx     History   Social History  . Marital Status: Married    Spouse Name: N/A    Number of Children: 2  . Years of Education: N/A    Occupational History  .     Social History Main Topics  . Smoking status: Former Smoker    Quit date: 01/24/1978  . Smokeless tobacco: Not on file  . Alcohol Use: Yes     occasional  . Drug Use: No  . Sexually Active: Not on file   Other Topics Concern  . Not on file   Social History Narrative   Daily caffeine    ROS: Please see the HPI.  All other systems reviewed and negative.  PHYSICAL EXAM:  BP 122/74  Pulse 71  Ht 6' (1.829 m)  Wt 183 lb (83.008 kg)  BMI 24.82 kg/m2  General: Well developed, well nourished, in no acute distress. Head:  Normocephalic and atraumatic. Neck: no JVD Lungs: Clear to auscultation and percussion. Heart: Normal S1 and S2.  No murmur, rubs or gallops.  Abdomen:  Normal bowel sounds; soft; non  tender; no organomegaly Pulses: Pulses normal in all 4 extremities. Extremities: No clubbing or cyanosis. No edema. Neurologic: Alert and oriented x 3.  EKG:  None done. Nuclear study;  07/20/2010:   Exercise Capacity: Good exercise capacity.  BP Response: Normal blood pressure response.  Clinical Symptoms: No chest pain.  ECG Impression: No significant ST segment change suggestive of ischemia.  Comparison with Prior Nuclear Study: No significant change from previous study  Overall Impression: Low risk stress nuclear study. No evidence of ischemia. Normal LV function.  Darden Amber.    ASSESSMENT AND PLAN:

## 2010-08-23 NOTE — Assessment & Plan Note (Signed)
Repeat BP on lower dose of amlodipine are excellent.  Told him to keep an eye on these, and depending on his weight might be able to reduce.

## 2010-08-23 NOTE — Assessment & Plan Note (Addendum)
Doing well from a cardiac standpoint.  See results of nuclear study.  Will continue observation.   Had good exercise capacity, no chest pain, and no major perfusion defects.  Will see back again in six months, and repeat ECG at that time for continuity.  No other changes.

## 2010-08-23 NOTE — Assessment & Plan Note (Signed)
Last lipid fairly good.  LDL is ok.

## 2010-08-23 NOTE — Progress Notes (Signed)
   Patient ID: Jeffery [name], male    DOB: 1940/01/30, 70 y.o.   MRN: [id number]  HPI    Review of Systems    Physical Exam

## 2010-10-02 ENCOUNTER — Other Ambulatory Visit: Payer: Self-pay | Admitting: Family Medicine

## 2010-10-21 ENCOUNTER — Other Ambulatory Visit: Payer: Self-pay | Admitting: Internal Medicine

## 2010-11-03 ENCOUNTER — Other Ambulatory Visit: Payer: Self-pay | Admitting: Family Medicine

## 2010-11-10 ENCOUNTER — Encounter: Payer: Self-pay | Admitting: Family Medicine

## 2010-11-10 ENCOUNTER — Ambulatory Visit (INDEPENDENT_AMBULATORY_CARE_PROVIDER_SITE_OTHER): Payer: Medicare Other | Admitting: Family Medicine

## 2010-11-10 VITALS — BP 120/82 | HR 78 | Temp 98.1°F | Ht 72.0 in | Wt 182.8 lb

## 2010-11-10 DIAGNOSIS — Z23 Encounter for immunization: Secondary | ICD-10-CM

## 2010-11-10 DIAGNOSIS — M7710 Lateral epicondylitis, unspecified elbow: Secondary | ICD-10-CM

## 2010-11-10 MED ORDER — NITROGLYCERIN 0.2 MG/HR TD PT24: MEDICATED_PATCH | TRANSDERMAL | Status: DC

## 2010-11-10 NOTE — Progress Notes (Signed)
  Subjective:    Patient ID: Jeffery [name], male    DOB: Jul 18, 1940, 70 y.o.   MRN: [id number]  HPI  Jeffery [name], a 70 y.o. male presents today in the office for the following:    5-10 years ago had some LE. ME and LE.  Taking some ibuprofen, aspercreme. Heat.   Patient presents with lateral elbow pain.  Length of symptoms: 2-3 mo Hand effected: R  Patient describes a dull ache on the lateral elbow and medial. There is some translation in the proximal forearm and in the distal upper arm. It is painful to lift with the hand facing down and to lift with the thumb in an upright position. Supination is painful. Patient points to the lateral epicondyle as the point of maximal tenderness near ECRB and medial epicondyle.  No trauma.   No prior fractures or operative interventions in the effective hand. Prior PT or HEP: none  Denies numbness or tingling. No significant neck or shoulder pain.  Hand of dominance: R  REVIEW OF SYSTEMS  GEN: No fevers, chills. Nontoxic. Primarily MSK c/o today. MSK: Detailed in the HPI GI: tolerating PO intake without difficulty Neuro: No numbness, parasthesias, or tingling associated. Otherwise the pertinent positives of the ROS are noted above.   PHYSICAL EXAM  Blood pressure 120/82, pulse 78, temperature 98.1 F (36.7 C), temperature source Oral, height 6' (1.829 m), weight 182 lb 12.8 oz (82.918 kg), SpO2 98.00%.  GEN: Well-developed,well-nourished,in no acute distress; alert,appropriate and cooperative throughout examination HEENT: Normocephalic and atraumatic without obvious abnormalities. Ears, externally no deformities PULM: Breathing comfortably in no respiratory distress EXT: No clubbing, cyanosis, or edema PSYCH: Normally interactive. Cooperative during the interview. Pleasant. Friendly and conversant. Not anxious or depressed appearing. Normal, full affect.  R elbow Ecchymosis or edema: neg ROM: full flexion, extension, pronation,  supination Shoulder ROM: Full Flexion: 5/5 Extension: 5/5, PAINFUL Supination: 5/5, PAINFUL Pronation: 5/5, painful Wrist ext: 5/5 Wrist flexion: 5/5 No gross bony abnormality Varus and Valgus stress: stable ECRB tenderness: YES, TTP Medial epicondyle: TTP Lateral epicondyle, resisted wrist extension from wrist full pronation and flexion: PAINFUL grip: 5/5  sensation intact Tinel's, Elbow: negative  A/P: Lateral Epicondylitis: Elbow anatomy was reviewed, and tendinopathy was explained.  Pt. given a formal rehab program from Dr. Rosana Berger at [facility] and the [facility] on elbow rehabiliation.  Series of concentric and eccentric exercises should be done starting with no weight, work up to 1 lb, hammer, etc.  Use counterforce strap if working or using hands.  Formal PT would be beneficial if not improving at f/u Emphasized stretching an cross-friction massage Emphasized proper palms up lifting biomechanics to unload ECRB  NTG patches given  Review of Systems     Objective:   Physical Exam        Assessment & Plan:

## 2010-11-10 NOTE — Patient Instructions (Signed)
Recheck 6 weeks

## 2010-11-16 ENCOUNTER — Other Ambulatory Visit: Payer: Self-pay | Admitting: *Deleted

## 2010-11-16 MED ORDER — HYDROCHLOROTHIAZIDE 12.5 MG PO CAPS: 12.5000 mg | ORAL_CAPSULE | Freq: Every day | ORAL | Status: DC

## 2010-12-09 ENCOUNTER — Other Ambulatory Visit: Payer: Self-pay | Admitting: Family Medicine

## 2010-12-22 ENCOUNTER — Encounter: Payer: Self-pay | Admitting: Family Medicine

## 2010-12-22 ENCOUNTER — Ambulatory Visit (INDEPENDENT_AMBULATORY_CARE_PROVIDER_SITE_OTHER): Payer: Medicare Other | Admitting: Family Medicine

## 2010-12-22 VITALS — BP 120/72 | HR 66 | Temp 98.1°F | Ht 72.0 in | Wt 183.8 lb

## 2010-12-22 DIAGNOSIS — M7701 Medial epicondylitis, right elbow: Secondary | ICD-10-CM

## 2010-12-22 DIAGNOSIS — M7711 Lateral epicondylitis, right elbow: Secondary | ICD-10-CM

## 2010-12-22 DIAGNOSIS — M7700 Medial epicondylitis, unspecified elbow: Secondary | ICD-10-CM

## 2010-12-22 DIAGNOSIS — M7710 Lateral epicondylitis, unspecified elbow: Secondary | ICD-10-CM

## 2010-12-22 NOTE — Progress Notes (Signed)
  Patient Name: Jeffery [name] Date of Birth: 1940/02/05 Age: 70 y.o. Medical Record Number: [id number] Gender: male  History of Present Illness:  Jeffery [name] is a 70 y.o. very pleasant male patient who presents with the following:  F/u R LE and ME with minimal improvement. Has been compliant with [facility] elbow program, has been wearing NTG patches. Maybe slightly better --- if any, minimal. Tolerating NTG patches OK.  Past Medical History, Surgical History, Social History, Family History, and Problem List have been reviewed in EHR and updated if relevant.  Review of Systems:  GEN: No fevers, chills. Nontoxic. Primarily MSK c/o today. MSK: Detailed in the HPI GI: tolerating PO intake without difficulty Neuro: No numbness, parasthesias, or tingling associated. Otherwise the pertinent positives of the ROS are noted above.    Physical Examination: Filed Vitals:   12/22/10 0759  BP: 120/72  Pulse: 66  Temp: 98.1 F (36.7 C)  TempSrc: Oral  Height: 6' (1.829 m)  Weight: 183 lb 12.8 oz (83.371 kg)  SpO2: 100%     GEN: WDWN, NAD, Non-toxic, Alert & Oriented x 3 HEENT: Atraumatic, Normocephalic.  Ears and Nose: No external deformity. EXTR: No clubbing/cyanosis/edema NEURO: Normal gait.  PSYCH: Normally interactive. Conversant. Not depressed or anxious appearing.  Calm demeanor.   Elbow: R Ecchymosis or edema: neg ROM: full flexion, extension, pronation, supination Shoulder ROM: Full Flexion: 5/5 Extension: 5/5, PAINFUL Supination: 5/5, PAINFUL Pronation: 5/5, painful Wrist ext: 5/5 Wrist flexion: 5/5 No gross bony abnormality Varus and Valgus stress: stable ECRB tenderness: YES, TTP Medial epicondyle: TTP at ME Lateral epicondyle, resisted wrist extension from wrist full pronation and flexion: PAINFUL grip: 5/5  sensation intact Tinel's, Elbow: negative    Assessment and Plan: 1. Medial epicondylitis of right elbow   2. Lateral epicondylitis of right elbow       Not improved. Will inject medial and lateral elbow. Rest x 1 week. Ice bid. Then restart HEP. Recheck 6 weeks  Lateral Epicondylitis Injection, R Verbal consent was obtained from the patient. Risks, benefits, and alternatives were discussed. Potential complications including loss of pigment, atrophy, and rare risk of infection were discussed. Prepped with Chloraprep and Ethyl Chloride used for anesthesia. Under sterile conditions, the patient was injected at the point of maximal tenderness at the ECRB tendon with 1/2 cc of Lidocaine 1% and 1/2 cc of Depo-Medrol 40 mg. Decreased pain after injection. No complications.  Needle size: 22 gauge 1 1/2 inch   Medial Epicondylitis Injection, R Verbal consent was obtained from the patient. Risks (including rare risk of infection, potential risk of atrophy and potential risk of skin lightening. Also, there is potential risk of ulnar nerve block and a rare risk of nerve damage), benefits, and alternatives were discussed. Potential complications including loss of pigment and atrophy were discussed. Prepped with Chloraprep and Ethyl Chloride used for anesthesia. Under sterile conditions, the patient was injected at the point of maximal tenderness 1 cm distal to medial epicondyle with 1/2 cc of Lidocaine 1% and 1/2 cc of Depo-Medrol 40 mg with peppering. Decreased pain after injection. No complications.  Needle size: 22 gauge 1 1/2 inch

## 2010-12-22 NOTE — Patient Instructions (Signed)
F/u 6 weeks

## 2011-01-17 ENCOUNTER — Other Ambulatory Visit: Payer: Self-pay | Admitting: Internal Medicine

## 2011-02-08 ENCOUNTER — Ambulatory Visit: Payer: Medicare Other | Admitting: Family Medicine

## 2011-02-16 ENCOUNTER — Encounter: Payer: Self-pay | Admitting: Family Medicine

## 2011-02-16 ENCOUNTER — Ambulatory Visit (INDEPENDENT_AMBULATORY_CARE_PROVIDER_SITE_OTHER): Payer: Medicare Other | Admitting: Family Medicine

## 2011-02-16 VITALS — BP 120/72 | HR 65 | Temp 97.9°F | Ht 72.0 in | Wt 182.0 lb

## 2011-02-16 DIAGNOSIS — M7710 Lateral epicondylitis, unspecified elbow: Secondary | ICD-10-CM

## 2011-02-16 DIAGNOSIS — M62838 Other muscle spasm: Secondary | ICD-10-CM

## 2011-02-16 DIAGNOSIS — M7700 Medial epicondylitis, unspecified elbow: Secondary | ICD-10-CM

## 2011-02-16 MED ORDER — CYCLOBENZAPRINE HCL 10 MG PO TABS: 10.0000 mg | ORAL_TABLET | Freq: Three times a day (TID) | ORAL | Status: AC | PRN

## 2011-02-16 NOTE — Progress Notes (Signed)
  Patient Name: Jeffery [name] Date of Birth: [birth year] Age: 71 y.o. Medical Record Number: [id number] Gender: male Date of Encounter: 02/16/2011  History of Present Illness:  Jeffery [name] is a 71 y.o. very pleasant male patient who presents with the following:  R ME and LE better s/p inj and now doing HEP a few times a week  Neck pulls and all the down to lower back - had a URI and some pain in the some tightness in the neck. Will wake up some at night. Worse a little bit at night. No numbness, weakness. No parasthesias  Past Medical History, Surgical History, Social History, Family History, Problem List, Medications, and Allergies have been reviewed and updated if relevant.  Review of Systems:  GEN: No fevers, chills. Nontoxic. Primarily MSK c/o today. MSK: Detailed in the HPI GI: tolerating PO intake without difficulty Neuro: No numbness, parasthesias, or tingling associated. Otherwise the pertinent positives of the ROS are noted above.    Physical Examination: Filed Vitals:   02/16/11 0806  BP: 120/72  Pulse: 65  Temp: 97.9 F (36.6 C)  TempSrc: Oral  Height: 6' (1.829 m)  Weight: 182 lb (82.555 kg)  SpO2: 97%    Body mass index is 24.68 kg/(m^2).   GEN: Well-developed,well-nourished,in no acute distress; alert,appropriate and cooperative throughout examination HEENT: Normocephalic and atraumatic without obvious abnormalities. Ears, externally no deformities PULM: Breathing comfortably in no respiratory distress EXT: No clubbing, cyanosis, or edema PSYCH: Normally interactive. Cooperative during the interview. Pleasant. Friendly and conversant. Not anxious or depressed appearing. Normal, full affect.  CERVICAL SPINE EXAM Range of motion: Flexion, extension, lateral bending, and rotation: normal Pain with terminal motion: minimal with terminal flexion Spinous Processes: NT SCM: NT Upper paracervical muscles: nt Upper traps: mild tenderness L trap C5-T1 intact,  sensation and motor  Elbow, full ROM, NT throughout  Assessment and Plan: 1. Trapezius muscle spasm  cyclobenzaprine (FLEXERIL) 10 MG tablet  2. Medial epicondylitis    3. Lateral epicondylitis      LE and ME improved  Reviewed HEP for trap, flexeril at night

## 2011-02-21 ENCOUNTER — Ambulatory Visit (INDEPENDENT_AMBULATORY_CARE_PROVIDER_SITE_OTHER): Payer: Medicare Other | Admitting: Cardiology

## 2011-02-21 ENCOUNTER — Encounter: Payer: Self-pay | Admitting: Cardiology

## 2011-02-21 DIAGNOSIS — I10 Essential (primary) hypertension: Secondary | ICD-10-CM

## 2011-02-21 DIAGNOSIS — I2510 Atherosclerotic heart disease of native coronary artery without angina pectoris: Secondary | ICD-10-CM

## 2011-02-21 DIAGNOSIS — E7800 Pure hypercholesterolemia, unspecified: Secondary | ICD-10-CM

## 2011-02-21 DIAGNOSIS — E782 Mixed hyperlipidemia: Secondary | ICD-10-CM

## 2011-02-21 DIAGNOSIS — R252 Cramp and spasm: Secondary | ICD-10-CM | POA: Insufficient documentation

## 2011-02-21 LAB — HEPATIC FUNCTION PANEL
Bilirubin, Direct: 0.2 mg/dL (ref 0.0–0.3)
Total Bilirubin: 1.4 mg/dL — ABNORMAL HIGH (ref 0.3–1.2)
Total Protein: 6.8 g/dL (ref 6.0–8.3)

## 2011-02-21 LAB — BASIC METABOLIC PANEL
BUN: 18 mg/dL (ref 6–23)
Chloride: 103 mEq/L (ref 96–112)
GFR: 83.26 mL/min (ref >60.00)
Potassium: 4.7 mEq/L (ref 3.5–5.1)
Sodium: 140 mEq/L (ref 135–145)

## 2011-02-21 LAB — LIPID PANEL
Cholesterol: 147 mg/dL (ref 0–200)
HDL: 43.1 mg/dL (ref >39.00)
LDL Cholesterol: 94 mg/dL (ref 0–99)
VLDL: 9.6 mg/dL (ref 0.0–40.0)

## 2011-02-21 LAB — MAGNESIUM: Magnesium: 2 mg/dL (ref 1.5–2.5)

## 2011-02-21 NOTE — Progress Notes (Signed)
HPI:  Doing well from a cardiac standpoint.  No chest pain.  No major symptoms.  Does have some leg cramps and we talked about his medications.  Last BMET was in May 2012.    Current Outpatient Prescriptions  Medication Sig Dispense Refill  . amLODipine (NORVASC) 10 MG tablet Take 5 mg by mouth daily.       Marland Kitchen atorvastatin (LIPITOR) 20 MG tablet TAKE 1 TABLET BY MOUTH AT BEDTIME  90 tablet  0  . cyclobenzaprine (FLEXERIL) 10 MG tablet Take 1 tablet (10 mg total) by mouth 3 (three) times daily as needed for muscle spasms.  45 tablet  2  . folic acid (FOLVITE) 1 MG tablet Take 1 tablet by mouth once daily  100 tablet  0  . Glucosamine-Chondroitin 250-200 MG TABS Take 1 tablet by mouth daily.        . hydrochlorothiazide (MICROZIDE) 12.5 MG capsule Take 1 capsule (12.5 mg total) by mouth daily.  90 capsule  3  . latanoprost (XALATAN) 0.005 % ophthalmic solution Place 1 drop into both eyes at bedtime.        Marland Kitchen losartan (COZAAR) 100 MG tablet TAKE 1 TABLET BY MOUTH DAILY  90 tablet  3  . Multiple Vitamins-Minerals (CENTRUM PO) Take 1 tablet by mouth daily.        Marland Kitchen omeprazole (PRILOSEC) 20 MG capsule       . sulfaSALAzine (AZULFIDINE) 500 MG tablet Take 2 tablets by mouth twice daily  360 tablet  1    Allergies  Allergen Reactions  . Ace Inhibitors     REACTION: cough    Past Medical History  Diagnosis Date  . Anal fistula   . History of esophagitis   . Internal hemorrhoids   . Hyperplastic colon polyp   . Esophageal motility disorder   . CAD (coronary artery disease)   . Crohn disease   . Hyperlipidemia   . Hypertension   . Hearing loss   . History of BPH     Past Surgical History  Procedure Date  . Eye surgery     right eye  . Hernia repair     x 2  . Lipoma excision     chest  . Prostate surgery     x 2, TURP    Family History  Problem Relation Age of Onset  . Colon polyps Mother   . Colon cancer Neg Hx     History   Social History  . Marital Status:  Married    Spouse Name: N/A    Number of Children: 2  . Years of Education: N/A   Occupational History  .     Social History Main Topics  . Smoking status: Former Smoker    Quit date: 01/24/1978  . Smokeless tobacco: Not on file  . Alcohol Use: Yes     occasional  . Drug Use: No  . Sexually Active: Not on file   Other Topics Concern  . Not on file   Social History Narrative   Daily caffeine    ROS: Please see the HPI.  All other systems reviewed and negative.  PHYSICAL EXAM:  BP 112/72  Pulse 64  Ht 6' (1.829 m)  Wt 82.609 kg (182 lb 1.9 oz)  BMI 24.70 kg/m2  General: Well developed, well nourished, in no acute distress. Head:  Normocephalic and atraumatic. Neck: no JVD Lungs: Clear to auscultation and percussion. Heart: Normal S1 and S2.  No murmur, rubs  or gallops.  Pulses: Pulses normal in all 4 extremities. Extremities: No clubbing or cyanosis. No edema. Neurologic: Alert and oriented x 3.  EKG:  NSR.. WNL.  Compared to May 2012, T wave inversion in I, AVL is resolved.  Normal tracing.   ASSESSMENT AND PLAN:

## 2011-02-21 NOTE — Assessment & Plan Note (Signed)
Nicely controlled at this point.  Continue meds.

## 2011-02-21 NOTE — Assessment & Plan Note (Signed)
On atorvastatin.  Will recheck lipid and liver profile.

## 2011-02-21 NOTE — Patient Instructions (Signed)
Your physician recommends that you have lab work today: LIPID, LIVER, BMP and Magnesium  Your physician wants you to follow-up in: 1 YEAR.  You will receive a reminder letter in the mail two months in advance. If you don't receive a letter, please call our office to schedule the follow-up appointment.  Your physician recommends that you continue on your current medications as directed. Please refer to the Current Medication list given to you today.

## 2011-02-21 NOTE — Assessment & Plan Note (Signed)
Will check BMET and Mg as he is on a diuretic.

## 2011-02-21 NOTE — Assessment & Plan Note (Signed)
Patient's ECG is normal and he is feeling well.  Last studies noted in overview.  Continue medical therapy.

## 2011-02-22 ENCOUNTER — Encounter: Payer: Self-pay | Admitting: Internal Medicine

## 2011-02-23 ENCOUNTER — Ambulatory Visit: Payer: Medicare Other | Admitting: Cardiology

## 2011-03-07 ENCOUNTER — Telehealth: Payer: Self-pay | Admitting: Cardiology

## 2011-03-07 ENCOUNTER — Encounter: Payer: Self-pay | Admitting: *Deleted

## 2011-03-07 NOTE — Telephone Encounter (Signed)
New msg Pt returning your call

## 2011-03-07 NOTE — Telephone Encounter (Signed)
Pt aware of lab results from 02/21/11.  Copy mailed to him He would like to have labs redrawn in 3 months. Horton Chin RN

## 2011-03-10 ENCOUNTER — Encounter: Payer: Self-pay | Admitting: Internal Medicine

## 2011-03-14 ENCOUNTER — Other Ambulatory Visit: Payer: Self-pay | Admitting: *Deleted

## 2011-03-14 MED ORDER — ATORVASTATIN CALCIUM 20 MG PO TABS: 20.0000 mg | ORAL_TABLET | Freq: Every day | ORAL | Status: DC

## 2011-03-23 ENCOUNTER — Other Ambulatory Visit: Payer: Self-pay | Admitting: Internal Medicine

## 2011-03-24 ENCOUNTER — Telehealth: Payer: Self-pay | Admitting: Internal Medicine

## 2011-03-24 ENCOUNTER — Other Ambulatory Visit: Payer: Self-pay | Admitting: Internal Medicine

## 2011-03-24 NOTE — Telephone Encounter (Signed)
No answer. Message left on voice mail Jeffery [name]

## 2011-03-24 NOTE — Telephone Encounter (Signed)
Pt wants to know about a prescription that has been declined for refill by pt's pharmacy.  Pt is coming for [facility] tomorrow 3/1 and will talk to Dr. Nichola Sizer nurse then. Ulice Dash

## 2011-03-25 ENCOUNTER — Ambulatory Visit (AMBULATORY_SURGERY_CENTER): Payer: Medicare Other | Admitting: *Deleted

## 2011-03-25 ENCOUNTER — Other Ambulatory Visit: Payer: Self-pay | Admitting: *Deleted

## 2011-03-25 VITALS — Ht 72.0 in | Wt 187.8 lb

## 2011-03-25 DIAGNOSIS — K5100 Ulcerative (chronic) pancolitis without complications: Secondary | ICD-10-CM

## 2011-03-25 MED ORDER — PEG-KCL-NACL-NASULF-NA ASC-C 100 G PO SOLR: ORAL | Status: DC

## 2011-03-25 MED ORDER — FOLIC ACID 1 MG PO TABS: ORAL_TABLET | ORAL | Status: DC

## 2011-03-25 MED ORDER — SULFASALAZINE 500 MG PO TABS: ORAL_TABLET | ORAL | Status: DC

## 2011-03-25 NOTE — Telephone Encounter (Signed)
Pt has colonoscopy scheduled for 04/15/11. We will give him a 1 month refill of his medications to get him thru until his appt.

## 2011-04-15 ENCOUNTER — Ambulatory Visit (AMBULATORY_SURGERY_CENTER): Payer: Medicare Other | Admitting: Internal Medicine

## 2011-04-15 ENCOUNTER — Encounter: Payer: Self-pay | Admitting: Internal Medicine

## 2011-04-15 VITALS — BP 137/81 | HR 70 | Temp 97.6°F | Resp 0 | Ht 72.0 in | Wt 187.0 lb

## 2011-04-15 DIAGNOSIS — D126 Benign neoplasm of colon, unspecified: Secondary | ICD-10-CM

## 2011-04-15 DIAGNOSIS — K5100 Ulcerative (chronic) pancolitis without complications: Secondary | ICD-10-CM

## 2011-04-15 DIAGNOSIS — K5090 Crohn's disease, unspecified, without complications: Secondary | ICD-10-CM

## 2011-04-15 MED ORDER — SODIUM CHLORIDE 0.9 % IV SOLN: 500.0000 mL | INTRAVENOUS | Status: DC

## 2011-04-15 NOTE — Op Note (Signed)
[facility] [address] [facility] [address] [place], [place]  [id number]  COLONOSCOPY PROCEDURE REPORT  PATIENT:  Jeffery [name], Jeffery [name]  MR#:  [id number] BIRTHDATE:  03-20-1940, 70 yrs. old  GENDER:  male ENDOSCOPIST:  Jeffery Bandy. Olevia Perches, MD REF. BY: PROCEDURE DATE:  04/15/2011 PROCEDURE:  Colonoscopy with biopsy ASA CLASS:  Class II INDICATIONS:  Crohn's disease last colon 2007, and before that in 2002, he is in remission on Azulfidine MEDICATIONS:   MAC sedation, administered by CRNA, propofol (Diprivan) 140 mg  DESCRIPTION OF PROCEDURE:   After the risks and benefits and of the procedure were explained, informed consent was obtained. Digital rectal exam was performed and revealed no rectal masses. The LB [id number] endoscope was introduced through the anus and advanced to the cecum, which was identified by both the appendix and ileocecal valve.  The quality of the prep was good, using MoviPrep.  The instrument was then slowly withdrawn as the colon was fully examined. <<PROCEDUREIMAGES>>  FINDINGS:  No polyps or cancers were seen. Random biopsies were obtained and sent to pathology (see image1, image2, and image3). left and right colon biopsies,  Internal Hemorrhoids were found (see image4).   Retroflexed views in the rectum revealed no abnormalities.    The scope was then withdrawn from the patient and the procedure completed.  COMPLICATIONS:  None ENDOSCOPIC IMPRESSION: 1) No polyps or cancers 2) Internal hemorrhoids RECOMMENDATIONS: 1) Await biopsy results continue Azulfidine and Folic acid  REPEAT EXAM:  In 5 year(s) for.  ______________________________ Jeffery Bandy. Olevia Perches, MD  CC:  n. eSIGNED:   Lowella Bandy. Kayzlee Wirtanen at 04/15/2011 08:31 AM  [place], [phone]

## 2011-04-15 NOTE — Patient Instructions (Signed)
YOU HAD AN ENDOSCOPIC PROCEDURE TODAY AT THE [facility]: Refer to the procedure report that was given to you for any specific questions about what was found during the examination.  If the procedure report does not answer your questions, please call your gastroenterologist to clarify.  If you requested that your care partner not be given the details of your procedure findings, then the procedure report has been included in a sealed envelope for you to review at your convenience later.  YOU SHOULD EXPECT: Some feelings of bloating in the abdomen. Passage of more gas than usual.  Walking can help get rid of the air that was put into your GI tract during the procedure and reduce the bloating. If you had a lower endoscopy (such as a colonoscopy or flexible sigmoidoscopy) you may notice spotting of blood in your stool or on the toilet paper. If you underwent a bowel prep for your procedure, then you may not have a normal bowel movement for a few days.  DIET: Your first meal following the procedure should be a light meal and then it is ok to progress to your normal diet.  A half-sandwich or bowl of soup is an example of a good first meal.  Heavy or fried foods are harder to digest and may make you feel nauseous or bloated.  Likewise meals heavy in dairy and vegetables can cause extra gas to form and this can also increase the bloating.  Drink plenty of fluids but you should avoid alcoholic beverages for 24 hours.  ACTIVITY: Your care partner should take you home directly after the procedure.  You should plan to take it easy, moving slowly for the rest of the day.  You can resume normal activity the day after the procedure however you should NOT DRIVE or use heavy machinery for 24 hours (because of the sedation medicines used during the test).    SYMPTOMS TO REPORT IMMEDIATELY: A gastroenterologist can be reached at any hour.  During normal business hours, 8:30 AM to 5:00 PM Monday through Friday,  call [phone].  After hours and on weekends, please call the GI answering service at [phone] who will take a message and have the physician on call contact you.   Following lower endoscopy (colonoscopy or flexible sigmoidoscopy):  Excessive amounts of blood in the stool  Significant tenderness or worsening of abdominal pains  Swelling of the abdomen that is new, acute  Fever of 100F or higher    FOLLOW UP: If any biopsies were taken you will be contacted by phone or by letter within the next 1-3 weeks.  Call your gastroenterologist if you have not heard about the biopsies in 3 weeks.  Our staff will call the home number listed on your records the next business day following your procedure to check on you and address any questions or concerns that you may have at that time regarding the information given to you following your procedure. This is a courtesy call and so if there is no answer at the home number and we have not heard from you through the emergency physician on call, we will assume that you have returned to your regular daily activities without incident.  SIGNATURES/CONFIDENTIALITY: You and/or your care partner have signed paperwork which will be entered into your electronic medical record.  These signatures attest to the fact that that the information above on your After Visit Summary has been reviewed and is understood.  Full responsibility of the confidentiality   of this discharge information lies with you and/or your care-partner.     

## 2011-04-15 NOTE — Progress Notes (Signed)
Patient did not experience any of the following events: a burn prior to discharge; a fall within the facility; wrong site/side/patient/procedure/implant event; or a hospital transfer or hospital admission upon discharge from the facility. (G8907) Patient did not have preoperative order for IV antibiotic SSI prophylaxis. (G8918)  

## 2011-04-16 ENCOUNTER — Other Ambulatory Visit: Payer: Self-pay | Admitting: Internal Medicine

## 2011-04-18 ENCOUNTER — Telehealth: Payer: Self-pay

## 2011-04-18 NOTE — Telephone Encounter (Signed)
  Follow up Call-  Call back number 04/15/2011  Post procedure Call Back phone  # [phone] cell  Permission to leave phone message Yes     Patient questions:  Do you have a fever, pain , or abdominal swelling? no Pain Score  0 *  Have you tolerated food without any problems? yes  Have you been able to return to your normal activities? yes  Do you have any questions about your discharge instructions: Diet   no Medications  no Follow up visit  no  Do you have questions or concerns about your Care? no  Actions: * If pain score is 4 or above: No action needed, pain <4.

## 2011-04-19 ENCOUNTER — Encounter: Payer: Self-pay | Admitting: Internal Medicine

## 2011-06-06 ENCOUNTER — Ambulatory Visit (INDEPENDENT_AMBULATORY_CARE_PROVIDER_SITE_OTHER): Payer: Medicare Other | Admitting: *Deleted

## 2011-06-06 DIAGNOSIS — E7800 Pure hypercholesterolemia, unspecified: Secondary | ICD-10-CM

## 2011-06-06 DIAGNOSIS — I10 Essential (primary) hypertension: Secondary | ICD-10-CM

## 2011-06-06 DIAGNOSIS — E782 Mixed hyperlipidemia: Secondary | ICD-10-CM

## 2011-06-06 LAB — HEPATIC FUNCTION PANEL
ALT: 25 U/L (ref 0–53)
Alkaline Phosphatase: 49 U/L (ref 39–117)
Bilirubin, Direct: 0.1 mg/dL (ref 0.0–0.3)
Total Bilirubin: 1.3 mg/dL — ABNORMAL HIGH (ref 0.3–1.2)
Total Protein: 6.6 g/dL (ref 6.0–8.3)

## 2011-06-06 LAB — LIPID PANEL
LDL Cholesterol: 78 mg/dL (ref 0–99)
Total CHOL/HDL Ratio: 3

## 2011-06-14 ENCOUNTER — Other Ambulatory Visit: Payer: Self-pay | Admitting: Family Medicine

## 2011-06-17 ENCOUNTER — Telehealth: Payer: Self-pay | Admitting: Cardiology

## 2011-06-17 NOTE — Telephone Encounter (Signed)
Pt rtning call to Lauren to get lab results

## 2011-06-17 NOTE — Telephone Encounter (Signed)
Results called/ mailed copy.

## 2011-07-29 ENCOUNTER — Other Ambulatory Visit: Payer: Self-pay | Admitting: Internal Medicine

## 2011-07-31 ENCOUNTER — Other Ambulatory Visit: Payer: Self-pay | Admitting: Internal Medicine

## 2011-08-10 ENCOUNTER — Other Ambulatory Visit: Payer: Self-pay | Admitting: Family Medicine

## 2011-09-12 ENCOUNTER — Other Ambulatory Visit: Payer: Self-pay

## 2011-09-12 MED ORDER — ATORVASTATIN CALCIUM 20 MG PO TABS: 20.0000 mg | ORAL_TABLET | Freq: Every day | ORAL | Status: DC

## 2011-09-12 NOTE — Telephone Encounter (Signed)
Pt request refill atorvastation to [facility]. Advised pt while on phone refill done and pt scheduled appt to see Dr Lorelei Pont 10/03/11.

## 2011-10-03 ENCOUNTER — Ambulatory Visit (INDEPENDENT_AMBULATORY_CARE_PROVIDER_SITE_OTHER): Payer: Medicare Other | Admitting: Family Medicine

## 2011-10-03 ENCOUNTER — Encounter: Payer: Self-pay | Admitting: Family Medicine

## 2011-10-03 ENCOUNTER — Encounter: Payer: Self-pay | Admitting: *Deleted

## 2011-10-03 VITALS — BP 120/78 | HR 60 | Temp 98.6°F | Wt 188.2 lb

## 2011-10-03 DIAGNOSIS — N401 Enlarged prostate with lower urinary tract symptoms: Secondary | ICD-10-CM

## 2011-10-03 DIAGNOSIS — Z23 Encounter for immunization: Secondary | ICD-10-CM

## 2011-10-03 DIAGNOSIS — E7800 Pure hypercholesterolemia, unspecified: Secondary | ICD-10-CM

## 2011-10-03 DIAGNOSIS — Z125 Encounter for screening for malignant neoplasm of prostate: Secondary | ICD-10-CM

## 2011-10-03 DIAGNOSIS — N138 Other obstructive and reflux uropathy: Secondary | ICD-10-CM

## 2011-10-03 DIAGNOSIS — I10 Essential (primary) hypertension: Secondary | ICD-10-CM

## 2011-10-03 DIAGNOSIS — Z87448 Personal history of other diseases of urinary system: Secondary | ICD-10-CM

## 2011-10-03 DIAGNOSIS — N139 Obstructive and reflux uropathy, unspecified: Secondary | ICD-10-CM

## 2011-10-03 DIAGNOSIS — I2510 Atherosclerotic heart disease of native coronary artery without angina pectoris: Secondary | ICD-10-CM

## 2011-10-03 NOTE — Progress Notes (Signed)
Therapist, music at [facility] [place] [address] Phone: [phone] Fax: [phone]  Date:  10/03/2011   Name:  Jeffery [name]   DOB:  Oct 25, 1940   MRN:  [id number] Gender: male Age: 71 y.o.  PCP:  Owens Loffler, MD    Chief Complaint: Follow-up   History of Present Illness:  Jeffery [name] is a 71 y.o. pleasant patient who presents with the following:  Some stiff neck Will occ bad cramps in his thighs an dcramps, and will get at the same time. Not associated with activity  Aside from these things, the patient is actually doing well.  He does continue to have coronary disease, but he is stable without chest pain or shortness of breath. He does see Dr. Lia Foyer routinely.  His blood pressure is under good control at 120/78 today.  He is compliant in taking his cholesterol medication.  He also sees Dr. Olevia Perches routinely for his Crohn's disease. Crohn's disease.  Patient Active Problem List  Diagnosis  . HYPERCHOLESTEROLEMIA  IIA  . HYPERLIPIDEMIA TYPE IIB / III  . OTHER SPECIFIED FORMS OF HEARING LOSS  . HYPERTENSION, BENIGN  . CAD  . INTERNAL HEMORRHOIDS  . ESOPHAGEAL MOTILITY DISORDER  . CROHN'S DISEASE  . ANAL FISTULA  . COLONIC POLYPS, HYPERPLASTIC, HX OF  . ESOPHAGITIS, HX OF  . BENIGN PROSTATIC HYPERTROPHY, HX OF, S/P TURP    Past Medical History  Diagnosis Date  . Anal fistula   . History of esophagitis   . Internal hemorrhoids   . Hyperplastic colon polyp   . Esophageal motility disorder   . CAD (coronary artery disease)   . Crohn disease   . Hyperlipidemia   . Hypertension   . Hearing loss   . History of BPH     Past Surgical History  Procedure Date  . Eye surgery     right eye  . Hernia repair     x 2  . Lipoma excision     chest  . Prostate surgery     x 2, TURP  . Angioplasty 1990    History  Substance Use Topics  . Smoking status: Former Smoker    Quit date: 01/24/1978  . Smokeless tobacco: Never  Used  . Alcohol Use: Yes     occasional wine or beer weekly    Family History  Problem Relation Age of Onset  . Colon polyps Mother   . Colon cancer Neg Hx   . Esophageal cancer Neg Hx   . Rectal cancer Neg Hx   . Stomach cancer Neg Hx     Allergies  Allergen Reactions  . Ace Inhibitors Other (See Comments)    REACTION: cough    Medication list has been reviewed and updated.  Current Outpatient Prescriptions on File Prior to Visit  Medication Sig Dispense Refill  . amLODipine (NORVASC) 10 MG tablet Take 5 mg by mouth daily.       Marland Kitchen atorvastatin (LIPITOR) 20 MG tablet Take 1 tablet (20 mg total) by mouth daily.  90 tablet  0  . folic acid (FOLVITE) 1 MG tablet TAKE 1 TABLET BY MOUTH DAILY  100 tablet  1  . Glucosamine-Chondroitin 250-200 MG TABS Take 1 tablet by mouth daily.        . hydrochlorothiazide (MICROZIDE) 12.5 MG capsule Take 1 capsule (12.5 mg total) by mouth daily.  90 capsule  3  . latanoprost (XALATAN) 0.005 % ophthalmic solution Place 1 drop  into both eyes at bedtime.        Marland Kitchen losartan (COZAAR) 100 MG tablet TAKE 1 TABLET BY MOUTH DAILY  90 tablet  3  . Multiple Vitamins-Minerals (CENTRUM PO) Take 1 tablet by mouth daily.        Marland Kitchen omeprazole (PRILOSEC) 20 MG capsule Take 20 mg by mouth as needed.       . sulfaSALAzine (AZULFIDINE) 500 MG tablet TAKE 2 TABLETS BY MOUTH TWICE A DAY  360 tablet  3  . DISCONTD: amLODipine (NORVASC) 10 MG tablet TAKE 1 TABLET BY MOUTH EVERY DAY  90 tablet  0    Review of Systems:   GEN: No acute illnesses, no fevers, chills. GI: No n/v/d, eating normally Pulm: No SOB Interactive and getting along well at home.  Otherwise, ROS is as per the HPI.   Physical Examination: Filed Vitals:   10/03/11 0931  BP: 120/78  Pulse: 60  Temp: 98.6 F (37 C)   Filed Vitals:   10/03/11 0931  Weight: 188 lb 4 oz (85.39 kg)   There is no height on file to calculate BMI. Ideal Body Weight:     GEN: WDWN, NAD, Non-toxic, A & O x  3 HEENT: Atraumatic, Normocephalic. Neck supple. No masses, No LAD. Ears and Nose: No external deformity. CV: RRR, No M/G/R. No JVD. No thrill. No extra heart sounds. PULM: CTA B, no wheezes, crackles, rhonchi. No retractions. No resp. distress. No accessory muscle use. EXTR: No c/c/e NEURO Normal gait.  PSYCH: Normally interactive. Conversant. Not depressed or anxious appearing.  Calm demeanor.    Assessment and Plan:  1. BENIGN PROSTATIC HYPERTROPHY, HX OF, S/P TURP    2. Special screening for malignant neoplasm of prostate    3. Enlarged prostate with urinary obstruction  PSA  4. Immunization due  Flu vaccine greater than or equal to 3yo preservative free IM  5. HYPERCHOLESTEROLEMIA  IIA    6. HYPERTENSION, BENIGN    7. CAD       Generally doing well. Give him a flu shot today. Check for elevated PSA.  Chronic diseases are all doing well and stable.  CBC:    Component Value Date/Time   WBC 4.8 06/17/2010 0934   HGB 13.4 06/17/2010 0934   HCT 39.4 06/17/2010 0934   PLT 208.0 06/17/2010 0934   MCV 92.6 06/17/2010 0934   NEUTROABS 2.5 06/17/2010 0934   LYMPHSABS 1.6 06/17/2010 0934   MONOABS 0.6 06/17/2010 0934   EOSABS 0.1 06/17/2010 0934   BASOSABS 0.0 06/17/2010 2979    Basic Metabolic Panel:    Component Value Date/Time   NA 140 02/21/2011 0953   K 4.7 02/21/2011 0953   CL 103 02/21/2011 0953   CO2 30 02/21/2011 0953   BUN 18 02/21/2011 0953   CREATININE 1.0 02/21/2011 0953   GLUCOSE 94 02/21/2011 0953   CALCIUM 9.5 02/21/2011 0953   Lipids:    Component Value Date/Time   CHOL 144 06/06/2011 0837   TRIG 63.0 06/06/2011 0837   HDL 53.10 06/06/2011 0837   LDLDIRECT 160.7 06/16/2008 0913   VLDL 12.6 06/06/2011 0837   CHOLHDL 3 06/06/2011 8921     Results for orders placed in visit on 10/03/11  PSA      Component Value Range   PSA 1.17  0.10 - 4.00 ng/mL     Orders Today:  Orders Placed This Encounter  Procedures  . Flu vaccine greater than or equal to 3yo preservative  free IM  .  PSA    Medications Today: (Includes new updates added during medication reconciliation) No orders of the defined types were placed in this encounter.    Medications Discontinued: Medications Discontinued During This Encounter  Medication Reason  . amLODipine (NORVASC) 10 MG tablet Error     Owens Loffler, MD,

## 2011-12-07 ENCOUNTER — Other Ambulatory Visit: Payer: Self-pay | Admitting: Family Medicine

## 2011-12-18 ENCOUNTER — Other Ambulatory Visit: Payer: Self-pay | Admitting: Family Medicine

## 2012-01-05 ENCOUNTER — Other Ambulatory Visit: Payer: Self-pay | Admitting: Family Medicine

## 2012-01-31 ENCOUNTER — Encounter: Payer: Self-pay | Admitting: Cardiology

## 2012-01-31 ENCOUNTER — Ambulatory Visit (INDEPENDENT_AMBULATORY_CARE_PROVIDER_SITE_OTHER): Payer: Medicare Other | Admitting: Cardiology

## 2012-01-31 VITALS — BP 142/86 | HR 68 | Ht 72.0 in | Wt 187.0 lb

## 2012-01-31 DIAGNOSIS — E7800 Pure hypercholesterolemia, unspecified: Secondary | ICD-10-CM

## 2012-01-31 DIAGNOSIS — I10 Essential (primary) hypertension: Secondary | ICD-10-CM

## 2012-01-31 DIAGNOSIS — I2510 Atherosclerotic heart disease of native coronary artery without angina pectoris: Secondary | ICD-10-CM

## 2012-01-31 DIAGNOSIS — I25810 Atherosclerosis of coronary artery bypass graft(s) without angina pectoris: Secondary | ICD-10-CM

## 2012-01-31 DIAGNOSIS — K5090 Crohn's disease, unspecified, without complications: Secondary | ICD-10-CM

## 2012-01-31 LAB — BASIC METABOLIC PANEL
BUN: 17 mg/dL (ref 6–23)
CO2: 27 mEq/L (ref 19–32)
Calcium: 9.3 mg/dL (ref 8.4–10.5)
Chloride: 103 mEq/L (ref 96–112)
Creatinine, Ser: 0.9 mg/dL (ref 0.4–1.5)
Glucose, Bld: 101 mg/dL — ABNORMAL HIGH (ref 70–99)

## 2012-01-31 NOTE — Progress Notes (Signed)
HPI:  Patient is doing extremely well. He is known to symptoms. He works part time, but spends most of his time at [employer]. He does a lot of fishing. He has no new complaints.  Chron's seems to be under good control.  Current Outpatient Prescriptions  Medication Sig Dispense Refill  . amLODipine (NORVASC) 10 MG tablet Take 5 mg by mouth daily.       Marland Kitchen atorvastatin (LIPITOR) 20 MG tablet TAKE 1 TABLET BY MOUTH DAILY  90 tablet  0  . folic acid (FOLVITE) 1 MG tablet TAKE 1 TABLET BY MOUTH DAILY  100 tablet  1  . Glucosamine-Chondroitin 250-200 MG TABS Take 1 tablet by mouth daily.        . hydrochlorothiazide (MICROZIDE) 12.5 MG capsule TAKE 1 CAPSULE BY MOUTH DAILY  90 capsule  3  . latanoprost (XALATAN) 0.005 % ophthalmic solution Place 1 drop into both eyes at bedtime.        Marland Kitchen losartan (COZAAR) 100 MG tablet TAKE 1 TABLET BY MOUTH DAILY  90 tablet  4  . Multiple Vitamins-Minerals (CENTRUM PO) Take 1 tablet by mouth daily.        Marland Kitchen omeprazole (PRILOSEC) 20 MG capsule Take 20 mg by mouth as needed.       . sulfaSALAzine (AZULFIDINE) 500 MG tablet TAKE 2 TABLETS BY MOUTH TWICE A DAY  360 tablet  3    Allergies  Allergen Reactions  . Ace Inhibitors Other (See Comments)    REACTION: cough    Past Medical History  Diagnosis Date  . Anal fistula   . History of esophagitis   . Internal hemorrhoids   . Hyperplastic colon polyp   . Esophageal motility disorder   . CAD (coronary artery disease)   . Crohn disease   . Hyperlipidemia   . Hypertension   . Hearing loss   . History of BPH     Past Surgical History  Procedure Date  . Eye surgery     right eye  . Hernia repair     x 2  . Lipoma excision     chest  . Prostate surgery     x 2, TURP  . Angioplasty 1990    Family History  Problem Relation Age of Onset  . Colon polyps Mother   . Colon cancer Neg Hx   . Esophageal cancer Neg Hx   . Rectal cancer Neg Hx   . Stomach cancer Neg Hx     History   Social History   . Marital Status: Married    Spouse Name: N/A    Number of Children: 2  . Years of Education: N/A   Occupational History  .     Social History Main Topics  . Smoking status: Former Smoker    Quit date: 01/24/1978  . Smokeless tobacco: Never Used  . Alcohol Use: Yes     Comment: occasional wine or beer weekly  . Drug Use: No  . Sexually Active: Not on file   Other Topics Concern  . Not on file   Social History Narrative   Daily caffeine    ROS: Please see the HPI.  All other systems reviewed and negative.  PHYSICAL EXAM:  BP 142/86  Pulse 68  Ht 6' (1.829 m)  Wt 187 lb (84.823 kg)  BMI 25.36 kg/m2  SpO2 99%  General: Well developed, well nourished, in no acute distress. Head:  Normocephalic and atraumatic. Neck: no JVD Lungs: Clear  to auscultation and percussion. Heart: Normal S1 and S2.  No murmur, rubs or gallops.  Pulses: Pulses normal in all 4 extremities. Extremities: No clubbing or cyanosis. No edema. Neurologic: Alert and oriented x 3.  EKG:  NSR.  WNL.    ASSESSMENT AND PLAN:

## 2012-01-31 NOTE — Patient Instructions (Addendum)
Your physician wants you to follow-up in: 1 year with Dr. Angelena Form.  You will receive a reminder letter in the mail two months in advance. If  you don't receive a letter, please call our office to schedule the follow-up appointment.  Labs today:  BMET

## 2012-02-20 NOTE — Assessment & Plan Note (Signed)
Moderate dose statin---under good control.

## 2012-02-20 NOTE — Assessment & Plan Note (Signed)
Per patient, under good control without bleeding.

## 2012-02-20 NOTE — Assessment & Plan Note (Signed)
Modest control but stable.

## 2012-02-20 NOTE — Assessment & Plan Note (Signed)
Has non obstructive CAD.  Continue to observe.  Seems to be doing well.

## 2012-03-17 ENCOUNTER — Other Ambulatory Visit: Payer: Self-pay | Admitting: Internal Medicine

## 2012-03-17 ENCOUNTER — Other Ambulatory Visit: Payer: Self-pay | Admitting: Family Medicine

## 2012-06-12 ENCOUNTER — Other Ambulatory Visit: Payer: Self-pay | Admitting: Internal Medicine

## 2012-06-13 ENCOUNTER — Telehealth: Payer: Self-pay | Admitting: Internal Medicine

## 2012-06-14 MED ORDER — FOLIC ACID 1 MG PO TABS: ORAL_TABLET | ORAL | Status: DC

## 2012-06-14 NOTE — Telephone Encounter (Signed)
Pt called and scheduled appt for 5/70 folic acid has been sent to last until appt

## 2012-07-01 ENCOUNTER — Other Ambulatory Visit: Payer: Self-pay | Admitting: Family Medicine

## 2012-07-16 ENCOUNTER — Encounter: Payer: Self-pay | Admitting: *Deleted

## 2012-07-19 ENCOUNTER — Other Ambulatory Visit: Payer: Self-pay | Admitting: Family Medicine

## 2012-07-19 DIAGNOSIS — E785 Hyperlipidemia, unspecified: Secondary | ICD-10-CM

## 2012-07-19 DIAGNOSIS — Z125 Encounter for screening for malignant neoplasm of prostate: Secondary | ICD-10-CM

## 2012-07-19 DIAGNOSIS — Z79899 Other long term (current) drug therapy: Secondary | ICD-10-CM

## 2012-07-24 ENCOUNTER — Other Ambulatory Visit (INDEPENDENT_AMBULATORY_CARE_PROVIDER_SITE_OTHER): Payer: Medicare Other

## 2012-07-24 DIAGNOSIS — E785 Hyperlipidemia, unspecified: Secondary | ICD-10-CM

## 2012-07-24 DIAGNOSIS — E7800 Pure hypercholesterolemia, unspecified: Secondary | ICD-10-CM

## 2012-07-24 DIAGNOSIS — I10 Essential (primary) hypertension: Secondary | ICD-10-CM

## 2012-07-24 DIAGNOSIS — Z79899 Other long term (current) drug therapy: Secondary | ICD-10-CM

## 2012-07-24 DIAGNOSIS — Z125 Encounter for screening for malignant neoplasm of prostate: Secondary | ICD-10-CM

## 2012-07-24 LAB — HEPATIC FUNCTION PANEL
ALT: 23 U/L (ref 0–53)
Bilirubin, Direct: 0.2 mg/dL (ref 0.0–0.3)
Total Bilirubin: 1.2 mg/dL (ref 0.3–1.2)

## 2012-07-24 LAB — CBC WITH DIFFERENTIAL/PLATELET
Basophils Relative: 0.3 % (ref 0.0–3.0)
Eosinophils Absolute: 0.2 10*3/uL (ref 0.0–0.7)
HCT: 40.9 % (ref 39.0–52.0)
Hemoglobin: 13.6 g/dL (ref 13.0–17.0)
MCHC: 33.2 g/dL (ref 30.0–36.0)
MCV: 93.6 fl (ref 78.0–100.0)
Monocytes Absolute: 0.8 10*3/uL (ref 0.1–1.0)
Neutro Abs: 3.4 10*3/uL (ref 1.4–7.7)
RBC: 4.37 Mil/uL (ref 4.22–5.81)

## 2012-07-24 LAB — BASIC METABOLIC PANEL
BUN: 15 mg/dL (ref 6–23)
Creatinine, Ser: 0.9 mg/dL (ref 0.4–1.5)
GFR: 84.98 mL/min (ref >60.00)

## 2012-07-24 LAB — LIPID PANEL
LDL Cholesterol: 101 mg/dL — ABNORMAL HIGH (ref 0–99)
VLDL: 9.2 mg/dL (ref 0.0–40.0)

## 2012-07-31 ENCOUNTER — Encounter: Payer: Medicare Other | Admitting: Family Medicine

## 2012-08-02 ENCOUNTER — Encounter: Payer: Self-pay | Admitting: Family Medicine

## 2012-08-02 ENCOUNTER — Ambulatory Visit (INDEPENDENT_AMBULATORY_CARE_PROVIDER_SITE_OTHER): Payer: Medicare Other | Admitting: Family Medicine

## 2012-08-02 VITALS — BP 118/70 | HR 65 | Temp 98.3°F | Ht 72.0 in | Wt 183.0 lb

## 2012-08-02 DIAGNOSIS — S61209A Unspecified open wound of unspecified finger without damage to nail, initial encounter: Secondary | ICD-10-CM

## 2012-08-02 DIAGNOSIS — Z23 Encounter for immunization: Secondary | ICD-10-CM

## 2012-08-02 DIAGNOSIS — S61219A Laceration without foreign body of unspecified finger without damage to nail, initial encounter: Secondary | ICD-10-CM

## 2012-08-02 DIAGNOSIS — Z0000 Encounter for general adult medical examination without abnormal findings: Secondary | ICD-10-CM

## 2012-08-02 NOTE — Progress Notes (Signed)
Therapist, music at [facility] [place] [address] Phone: [phone] Fax: [phone]  Date:  08/02/2012   Name:  Jeffery [name]   DOB:  [birth year]   MRN:  [id number] Gender: male Age: 72 y.o.  Primary Physician:  Owens Loffler, MD  Evaluating MD: Owens Loffler, MD   Chief Complaint: Annual Exam   History of Present Illness:  Jeffery [name] is a 72 y.o. pleasant patient who presents with the following:  Medicare CPX:  Burn from a muffler and cut recently. Also got cut while fishing in the last week Tdap updated.   Preventative Health Maintenance Visit:  Health Maintenance Summary Reviewed and updated, unless pt declines services.  Tobacco History Reviewed. Alcohol: No concerns, no excessive use Exercise Habits: Some activity, rec at least 30 mins 5 times a week STD concerns: no risk or activity to increase risk Drug Use: None Encouraged self-testicular check  Health Maintenance  Topic Date Due  . Influenza Vaccine  09/24/2012  . Colonoscopy  04/14/2016  . Tetanus/tdap  08/03/2022  . Pneumococcal Polysaccharide Vaccine Age 8 And Over  Completed  . Zostavax  Completed    Labs reviewed with the patient.  Results for orders placed in visit on 07/24/12  LIPID PANEL      Result Value Range   Cholesterol 166  0 - 200 mg/dL   Triglycerides 46.0  0.0 - 149.0 mg/dL   HDL 55.90  >39.00 mg/dL   VLDL 9.2  0.0 - 40.0 mg/dL   LDL Cholesterol 101 (*) 0 - 99 mg/dL   Total CHOL/HDL Ratio 3    HEPATIC FUNCTION PANEL      Result Value Range   Total Bilirubin 1.2  0.3 - 1.2 mg/dL   Bilirubin, Direct 0.2  0.0 - 0.3 mg/dL   Alkaline Phosphatase 50  39 - 117 U/L   AST 29  0 - 37 U/L   ALT 23  0 - 53 U/L   Total Protein 6.5  6.0 - 8.3 g/dL   Albumin 4.1  3.5 - 5.2 g/dL  CBC WITH DIFFERENTIAL      Result Value Range   WBC 6.6  4.5 - 10.5 K/uL   RBC 4.37  4.22 - 5.81 Mil/uL   Hemoglobin 13.6  13.0 - 17.0 g/dL   HCT 40.9  39.0 - 52.0 %   MCV  93.6  78.0 - 100.0 fl   MCHC 33.2  30.0 - 36.0 g/dL   RDW 13.1  11.5 - 14.6 %   Platelets 208.0  150.0 - 400.0 K/uL   Neutrophils Relative % 52.4  43.0 - 77.0 %   Lymphocytes Relative 31.2  12.0 - 46.0 %   Monocytes Relative 12.8 (*) 3.0 - 12.0 %   Eosinophils Relative 3.3  0.0 - 5.0 %   Basophils Relative 0.3  0.0 - 3.0 %   Neutro Abs 3.4  1.4 - 7.7 K/uL   Lymphs Abs 2.0  0.7 - 4.0 K/uL   Monocytes Absolute 0.8  0.1 - 1.0 K/uL   Eosinophils Absolute 0.2  0.0 - 0.7 K/uL   Basophils Absolute 0.0  0.0 - 0.1 K/uL  BASIC METABOLIC PANEL      Result Value Range   Sodium 140  135 - 145 mEq/L   Potassium 4.1  3.5 - 5.1 mEq/L   Chloride 105  96 - 112 mEq/L   CO2 25  19 - 32 mEq/L   Glucose, Bld 102 (*) 70 -  99 mg/dL   BUN 15  6 - 23 mg/dL   Creatinine, Ser 0.9  0.4 - 1.5 mg/dL   Calcium 9.3  8.4 - 10.5 mg/dL   GFR 84.98  >60.00 mL/min  PSA, MEDICARE      Result Value Range   PSA 1.01  0.10 - 4.00 ng/ml     Patient Active Problem List   Diagnosis Date Noted  . OTHER SPECIFIED FORMS OF HEARING LOSS 08/10/2009  . HYPERCHOLESTEROLEMIA  IIA 11/25/2008  . BENIGN PROSTATIC HYPERTROPHY, HX OF, S/P TURP 08/07/2008  . INTERNAL HEMORRHOIDS 05/14/2008  . ESOPHAGEAL MOTILITY DISORDER 05/14/2008  . ANAL FISTULA 05/14/2008  . COLONIC POLYPS, HYPERPLASTIC, HX OF 05/14/2008  . ESOPHAGITIS, HX OF 05/14/2008  . HYPERTENSION, BENIGN 05/06/2008  . CAD 05/06/2008  . CROHN'S DISEASE 05/06/2008    Past Medical History  Diagnosis Date  . Anal fistula   . History of esophagitis   . Internal hemorrhoids   . Hyperplastic colon polyp   . Esophageal motility disorder   . CAD (coronary artery disease)   . Crohn disease   . Hyperlipidemia   . Hypertension   . Hearing loss   . History of BPH   . GERD (gastroesophageal reflux disease)     Past Surgical History  Procedure Laterality Date  . Eye surgery      right eye  . Hernia repair      x 2  . Lipoma excision      chest  . Prostate  surgery      x 2, TURP  . Angioplasty  1990    History   Social History  . Marital Status: Married    Spouse Name: N/A    Number of Children: 2  . Years of Education: N/A   Occupational History  .     Social History Main Topics  . Smoking status: Former Smoker    Quit date: 01/24/1978  . Smokeless tobacco: Never Used  . Alcohol Use: Yes     Comment: occasional wine or beer weekly  . Drug Use: No  . Sexually Active: Not on file   Other Topics Concern  . Not on file   Social History Narrative   Daily caffeine    Family History  Problem Relation Age of Onset  . Colon polyps Mother   . Colon cancer Neg Hx   . Esophageal cancer Neg Hx   . Rectal cancer Neg Hx   . Stomach cancer Neg Hx     Allergies  Allergen Reactions  . Ace Inhibitors Other (See Comments)    REACTION: cough    Medication list has been reviewed and updated.  Outpatient Prescriptions Prior to Visit  Medication Sig Dispense Refill  . amLODipine (NORVASC) 10 MG tablet Take 5 mg by mouth daily.       Marland Kitchen atorvastatin (LIPITOR) 20 MG tablet TAKE 1 TABLET BY MOUTH EVERY DAY  30 tablet  0  . folic acid (FOLVITE) 1 MG tablet TAKE 1 TABLET BY MOUTH DAILY  100 tablet  0  . hydrochlorothiazide (MICROZIDE) 12.5 MG capsule TAKE 1 CAPSULE BY MOUTH DAILY  90 capsule  3  . latanoprost (XALATAN) 0.005 % ophthalmic solution Place 1 drop into both eyes at bedtime.        Marland Kitchen losartan (COZAAR) 100 MG tablet TAKE 1 TABLET BY MOUTH DAILY  90 tablet  4  . Multiple Vitamins-Minerals (CENTRUM PO) Take 1 tablet by mouth daily.        Marland Kitchen  omeprazole (PRILOSEC) 20 MG capsule Take 20 mg by mouth as needed.       . sulfaSALAzine (AZULFIDINE) 500 MG tablet TAKE 2 TABLETS BY MOUTH TWICE A DAY  360 tablet  3  . Glucosamine-Chondroitin 250-200 MG TABS Take 1 tablet by mouth daily.         No facility-administered medications prior to visit.    Review of Systems:   General: Denies fever, chills, sweats. No significant weight  loss. Eyes: Denies blurring,significant itching ENT: Denies earache, sore throat, and hoarseness. Cardiovascular: Denies chest pains, palpitations, dyspnea on exertion Respiratory: Denies cough, dyspnea at rest,wheeezing Breast: no concerns about lumps GI: Denies nausea, vomiting, diarrhea, constipation, change in bowel habits, abdominal pain, melena, hematochezia. No crohn's flares GU: Denies penile discharge, ED, urinary flow / outflow problems. No STD concerns. Musculoskeletal: Denies back pain, joint pain Derm: Denies rash, itching Neuro: Denies  paresthesias, frequent falls, frequent headaches Psych: Denies depression, anxiety Endocrine: Denies cold intolerance, heat intolerance, polydipsia Heme: Denies enlarged lymph nodes Allergy: No hayfever   Physical Examination: BP 118/70  Pulse 65  Temp(Src) 98.3 F (36.8 C) (Oral)  Ht 6' (1.829 m)  Wt 183 lb (83.008 kg)  BMI 24.81 kg/m2  SpO2 97%  Ideal Body Weight: Weight in (lb) to have BMI = 25: 183.9   Wt Readings from Last 3 Encounters:  08/03/12 183 lb 4 oz (83.122 kg)  08/02/12 183 lb (83.008 kg)  01/31/12 187 lb (84.823 kg)    GEN: well developed, well nourished, no acute distress Eyes: conjunctiva and lids normal, PERRLA, EOMI ENT: TM clear, nares clear, oral exam WNL Neck: supple, no lymphadenopathy, no thyromegaly, no JVD Pulm: clear to auscultation and percussion, respiratory effort normal CV: regular rate and rhythm, S1-S2, no murmur, rub or gallop, no bruits, peripheral pulses normal and symmetric, no cyanosis, clubbing, edema or varicosities Chest: no scars, masses GI: soft, non-tender; no hepatosplenomegaly, masses; active bowel sounds all quadrants GU: no hernia, testicular mass, penile discharge, or prostate enlargement Lymph: no cervical, axillary or inguinal adenopathy MSK: gait normal, muscle tone and strength WNL, no joint swelling, effusions, discoloration, crepitus  SKIN: clear, good turgor, color  WNL, no rashes, lesions, or ulcerations Neuro: normal mental status, normal strength, sensation, and motion Psych: alert; oriented to person, place and time, normally interactive and not anxious or depressed in appearance.  Assessment and Plan:  Routine general medical examination at a health care facility  Finger laceration, initial encounter - Plan: Tdap vaccine greater than or equal to 7yo IM  I have personally reviewed the Medicare Annual Wellness questionnaire and have noted 1. The patient's medical and social history 2. Their use of alcohol, tobacco or illicit drugs 3. Their current medications and supplements 4. The patient's functional ability including ADL's, fall risks, home safety risks and hearing or visual             impairment. 5. Diet and physical activities 6. Evidence for depression or mood disorders  The patients weight, height, BMI and visual acuity have been recorded in the chart I have made referrals, counseling and provided education to the patient based review of the above and I have provided the pt with a written personalized care plan for preventive services.  I have provided the patient with a copy of your personalized plan for preventive services. Instructed to take the time to review along with their updated medication list.   Doing great. Minimal to discuss.  Orders Today:  Orders Placed This  Encounter  Procedures  . Tdap vaccine greater than or equal to 7yo IM    Updated Medication List: (Includes new medications, updates to list, dose adjustments) No orders of the defined types were placed in this encounter.    Medications Discontinued: Medications Discontinued During This Encounter  Medication Reason  . Glucosamine-Chondroitin 250-200 MG TABS Error      Signed, Artavious Trebilcock T. Britiany Silbernagel, MD 08/02/2012 2:57 PM

## 2012-08-03 ENCOUNTER — Other Ambulatory Visit: Payer: Self-pay | Admitting: Family Medicine

## 2012-08-03 ENCOUNTER — Ambulatory Visit (INDEPENDENT_AMBULATORY_CARE_PROVIDER_SITE_OTHER): Payer: Medicare Other | Admitting: Internal Medicine

## 2012-08-03 ENCOUNTER — Encounter: Payer: Self-pay | Admitting: Internal Medicine

## 2012-08-03 VITALS — BP 100/60 | HR 80 | Ht 70.0 in | Wt 183.2 lb

## 2012-08-03 DIAGNOSIS — K5090 Crohn's disease, unspecified, without complications: Secondary | ICD-10-CM

## 2012-08-03 MED ORDER — FOLIC ACID 1 MG PO TABS: ORAL_TABLET | ORAL | Status: DC

## 2012-08-03 MED ORDER — SULFASALAZINE 500 MG PO TABS: ORAL_TABLET | ORAL | Status: DC

## 2012-08-03 NOTE — Progress Notes (Signed)
ELSON ULBRICH 03-25-1940 MRN [id number]  History of Present Illness:  This is a 72 year old white male with granulomatous colitis diagnosed in 1986 on a flexible sigmoidoscopy. This is his yearly followup.appointment. He has been in remission since 2002. His last 2 colonoscopies in 2007 and in March 2013 were normal with random biopsies showing normal colon tissue. The initial colonoscopies in 1986, 1990, Valentine including 2002 showed multiple granulomas in the left and right colon. He never had disease in the small bowel area. He has a history of anal fissure but no fistula. The last time he was on prednisone was about 20 years ago. He is doing well on sulfasalazine 1 g twice a day and folic acid 1 mg daily. He has occasional diarrhea but no rectal bleeding, his weight has been stable.   Past Medical History  Diagnosis Date  . Anal fistula   . History of esophagitis   . Internal hemorrhoids   . Hyperplastic colon polyp   . Esophageal motility disorder   . CAD (coronary artery disease)   . Crohn disease   . Hyperlipidemia   . Hypertension   . Hearing loss   . History of BPH   . GERD (gastroesophageal reflux disease)    Past Surgical History  Procedure Laterality Date  . Eye surgery      right eye  . Hernia repair      x 2  . Lipoma excision      chest  . Prostate surgery      x 2, TURP  . Angioplasty  1990    reports that he quit smoking about 34 years ago. He has never used smokeless tobacco. He reports that  drinks alcohol. He reports that he does not use illicit drugs. family history includes Colon polyps in his mother.  There is no history of Colon cancer, and Esophageal cancer, and Rectal cancer, and Stomach cancer, . Allergies  Allergen Reactions  . Ace Inhibitors Other (See Comments)    REACTION: cough        Review of Systems: Denies heartburn dysphagia weight loss  The remainder of the 10 point ROS is negative except as outlined in H&P   Physical  Exam: General appearance  Well developed, in no distress. Eyes- non icteric. HEENT nontraumatic, normocephalic. Mouth no lesions, tongue papillated, no cheilosis. Neck supple without adenopathy, thyroid not enlarged, no carotid bruits, no JVD. Lungs Clear to auscultation bilaterally. Cor normal S1, normal S2, regular rhythm, no murmur,  quiet precordium. Abdomen: Soft nontender with minimal discomfort in the left upper quadrant. Normoactive bowel sounds. Liver edge at costal margin. Rectal: Soft Hemoccult negative stool. Normal lites sphincter tone. No perianal disease. Extremities no pedal edema. Skin no lesions. Neurological alert and oriented x 3. Psychological normal mood and affect.  Assessment and Plan:  Problem #13 72 year old white male with Crohn's colitis since 1986 who has been in remission since 2002. His last colonoscopy in March 2013 showed a normal colon. He can reduce his Azulfidine to 1.5 g a day and subsequently 1 g a day. He is followed by Dr. Lorelei Pont for routine medical care. I will see him in one year.   08/03/2012 [name]

## 2012-08-03 NOTE — Patient Instructions (Addendum)
We have sent the following medications to your pharmacy for you to pick up at your convenience: Azulfadine Folic Acid  CC:Dr Spencer Copland

## 2012-09-16 ENCOUNTER — Other Ambulatory Visit: Payer: Self-pay | Admitting: Family Medicine

## 2012-10-01 ENCOUNTER — Other Ambulatory Visit: Payer: Self-pay | Admitting: Internal Medicine

## 2012-11-20 ENCOUNTER — Ambulatory Visit (INDEPENDENT_AMBULATORY_CARE_PROVIDER_SITE_OTHER): Payer: Medicare Other

## 2012-11-20 DIAGNOSIS — Z23 Encounter for immunization: Secondary | ICD-10-CM

## 2012-11-21 ENCOUNTER — Other Ambulatory Visit: Payer: Self-pay | Admitting: Internal Medicine

## 2013-01-14 ENCOUNTER — Other Ambulatory Visit: Payer: Self-pay | Admitting: Internal Medicine

## 2013-01-28 ENCOUNTER — Ambulatory Visit (INDEPENDENT_AMBULATORY_CARE_PROVIDER_SITE_OTHER): Payer: No Typology Code available for payment source | Admitting: Internal Medicine

## 2013-01-28 ENCOUNTER — Encounter: Payer: Self-pay | Admitting: Internal Medicine

## 2013-01-28 VITALS — BP 118/64 | HR 81 | Temp 98.9°F | Wt 184.5 lb

## 2013-01-28 DIAGNOSIS — J069 Acute upper respiratory infection, unspecified: Secondary | ICD-10-CM

## 2013-01-28 MED ORDER — AZITHROMYCIN 250 MG PO TABS: ORAL_TABLET | ORAL | Status: DC

## 2013-01-28 MED ORDER — HYDROCODONE-HOMATROPINE 5-1.5 MG/5ML PO SYRP: 5.0000 mL | ORAL_SOLUTION | Freq: Three times a day (TID) | ORAL | Status: DC | PRN

## 2013-01-28 NOTE — Patient Instructions (Signed)

## 2013-01-28 NOTE — Progress Notes (Signed)
HPI  Pt presents to the clinic today with c/o cold symptoms x 4 days. It started in as a sore throat, but then settled into his chest. The cough is productive of thick green sputum. He has been taking sudafed, mucinex, robitussion and advil. The cough is worse at night.  He denies fever, but has had chills and body aches. He has no history of allergies or breathing problems. He did take a flu shot. He has not had sick contacts that he is aware of.   Review of Systems      Past Medical History  Diagnosis Date  . Anal fistula   . History of esophagitis   . Internal hemorrhoids   . Hyperplastic colon polyp   . Esophageal motility disorder   . CAD (coronary artery disease)   . Crohn disease   . Hyperlipidemia   . Hypertension   . Hearing loss   . History of BPH   . GERD (gastroesophageal reflux disease)     Family History  Problem Relation Age of Onset  . Colon polyps Mother   . Colon cancer Neg Hx   . Esophageal cancer Neg Hx   . Rectal cancer Neg Hx   . Stomach cancer Neg Hx     History   Social History  . Marital Status: Married    Spouse Name: N/A    Number of Children: 2  . Years of Education: N/A   Occupational History  .     Social History Main Topics  . Smoking status: Former Smoker    Quit date: 01/24/1978  . Smokeless tobacco: Never Used  . Alcohol Use: Yes     Comment: occasional wine or beer weekly  . Drug Use: No  . Sexual Activity: Not on file   Other Topics Concern  . Not on file   Social History Narrative   Daily caffeine    Allergies  Allergen Reactions  . Ace Inhibitors Other (See Comments)    REACTION: cough     Constitutional: Positive headache, fatigue. Denies fever or abrupt weight changes.  HEENT:  Positive sore throat. Denies eye redness, eye pain, pressure behind the eyes, facial pain, nasal congestion, ear pain, ringing in the ears, wax buildup, runny nose or bloody nose. Respiratory: Positive cough. Denies difficulty  breathing or shortness of breath.  Cardiovascular: Denies chest pain, chest tightness, palpitations or swelling in the hands or feet.   No other specific complaints in a complete review of systems (except as listed in HPI above).  Objective:   BP 118/64  Pulse 81  Temp(Src) 98.9 F (37.2 C) (Oral)  Wt 184 lb 8 oz (83.689 kg)  SpO2 97% Wt Readings from Last 3 Encounters:  01/28/13 184 lb 8 oz (83.689 kg)  08/03/12 183 lb 4 oz (83.122 kg)  08/02/12 183 lb (83.008 kg)     General: Appears his stated age, well developed, well nourished in NAD. HEENT: Head: normal shape and size; Eyes: sclera irritated, no icterus, conjunctiva erythematous, PERRLA and EOMs intact; Ears: Tm's gray and intact, normal light reflex; Nose: mucosa pink and moist, septum midline; Throat/Mouth: + PND. Teeth present, mucosa erythematous and moist, no exudate noted, no lesions or ulcerations noted.  Neck: Mild cervical lymphadenopathy. Neck supple, trachea midline. No massses, lumps or thyromegaly present.  Cardiovascular: Normal rate and rhythm. S1,S2 noted.  No murmur, rubs or gallops noted. No JVD or BLE edema. No carotid bruits noted. Pulmonary/Chest: Normal effort and positive vesicular breath sounds.  No respiratory distress. No wheezes, rales or ronchi noted.      Assessment & Plan:   Upper Respiratory Infection, with worsening symptoms  Get some rest and drink plenty of water Do salt water gargles for the sore throat eRx for Azithromax x 5 days eRx for Hycodan cough syrup  RTC as needed or if symptoms persist.

## 2013-01-28 NOTE — Progress Notes (Signed)
Pre-visit discussion using our clinic review tool. No additional management support is needed unless otherwise documented below in the visit note.  

## 2013-01-30 ENCOUNTER — Ambulatory Visit: Payer: Medicare Other | Admitting: Cardiovascular Disease

## 2013-02-01 ENCOUNTER — Ambulatory Visit (INDEPENDENT_AMBULATORY_CARE_PROVIDER_SITE_OTHER): Payer: Medicare Other | Admitting: Cardiovascular Disease

## 2013-02-01 ENCOUNTER — Encounter: Payer: Self-pay | Admitting: Cardiovascular Disease

## 2013-02-01 VITALS — BP 142/84 | HR 70 | Ht 72.0 in | Wt 188.0 lb

## 2013-02-01 DIAGNOSIS — I2510 Atherosclerotic heart disease of native coronary artery without angina pectoris: Secondary | ICD-10-CM

## 2013-02-01 NOTE — Patient Instructions (Signed)
Your physician wants you to follow-up in:  12 months.  You will receive a reminder letter in the mail two months in advance. If you don't receive a letter, please call our office to schedule the follow-up appointment.   

## 2013-02-01 NOTE — Progress Notes (Signed)
History of Present Illness: 73 yo male with history of CAD, HTN, HLD here today for cardiac follow up. He has been followed in the past by Dr. Lia Foyer. He presented with chest pain in August 2009. Cardiac cath with mild non-obstructive disease. He has Crohn's disease in remission. He has a place at [facility] and likes to fish.   He is here today for cardiac follow up. He has been feeling. NO chest pain or SOB. He is very active.   Primary Care Physician: Frederico Hamman Copland  Last Lipid Profile:Lipid Panel     Component Value Date/Time   CHOL 166 07/24/2012 0828   TRIG 46.0 07/24/2012 0828   HDL 55.90 07/24/2012 0828   CHOLHDL 3 07/24/2012 0828   VLDL 9.2 07/24/2012 0828   LDLCALC 101* 07/24/2012 0828     Past Medical History  Diagnosis Date  . Anal fistula   . History of esophagitis   . Internal hemorrhoids   . Hyperplastic colon polyp   . Esophageal motility disorder   . CAD (coronary artery disease)   . Crohn disease   . Hyperlipidemia   . Hypertension   . Hearing loss   . History of BPH   . GERD (gastroesophageal reflux disease)     Past Surgical History  Procedure Laterality Date  . Eye surgery      right eye  . Hernia repair      x 2  . Lipoma excision      chest  . Prostate surgery      x 2, TURP    Current Outpatient Prescriptions  Medication Sig Dispense Refill  . amLODipine (NORVASC) 10 MG tablet TAKE 1/2 TABLET BY MOUTH EVERY DAY      . atorvastatin (LIPITOR) 20 MG tablet TAKE 1 TABLET BY MOUTH EVERY DAY  30 tablet  0  . azithromycin (ZITHROMAX) 250 MG tablet Take 2 tabs today, then 1 tab daily x 4 days  6 tablet  0  . folic acid (FOLVITE) 1 MG tablet TAKE 1 TABLET BY MOUTH DAILY  100 tablet  1  . hydrochlorothiazide (MICROZIDE) 12.5 MG capsule TAKE 1 CAPSULE BY MOUTH DAILY  90 capsule  3  . HYDROcodone-homatropine (HYCODAN) 5-1.5 MG/5ML syrup Take 5 mLs by mouth every 8 (eight) hours as needed for cough.  120 mL  0  . latanoprost (XALATAN) 0.005 % ophthalmic  solution Place 1 drop into the right eye at bedtime.       Marland Kitchen losartan (COZAAR) 100 MG tablet TAKE 1 TABLET BY MOUTH DAILY  90 tablet  4  . Multiple Vitamins-Minerals (CENTRUM PO) Take 1 tablet by mouth daily.        Marland Kitchen sulfaSALAzine (AZULFIDINE) 500 MG tablet TAKE 2 TABLETS BY MOUTH TWICE A DAY  360 tablet  1  . aspirin 81 MG tablet Take 81 mg by mouth daily.       No current facility-administered medications for this visit.    Allergies  Allergen Reactions  . Ace Inhibitors Other (See Comments)    REACTION: cough    History   Social History  . Marital Status: Married    Spouse Name: N/A    Number of Children: 2  . Years of Education: N/A   Occupational History  . Retired-safety consulting    Social History Main Topics  . Smoking status: Former Smoker -- 1.00 packs/day for 20 years    Types: Cigarettes    Quit date: 01/24/1978  . Smokeless  tobacco: Never Used  . Alcohol Use: 0.5 oz/week    1 drink(s) per week     Comment: occasional wine or beer weekly  . Drug Use: No  . Sexual Activity: Not on file   Other Topics Concern  . Not on file   Social History Narrative   Daily caffeine    Family History  Problem Relation Age of Onset  . Colon polyps Mother   . Colon cancer Neg Hx   . Esophageal cancer Neg Hx   . Rectal cancer Neg Hx   . Stomach cancer Neg Hx   . Heart attack Father 27    Review of Systems:  As stated in the HPI and otherwise negative.   BP 142/84  Pulse 70  Ht 6' (1.829 m)  Wt 188 lb (85.276 kg)  BMI 25.49 kg/m2  Physical Examination: General: Well developed, well nourished, NAD HEENT: OP clear, mucus membranes moist SKIN: warm, dry. No rashes. Neuro: No focal deficits Musculoskeletal: Muscle strength 5/5 all ext Psychiatric: Mood and affect normal Neck: No JVD, no carotid bruits, no thyromegaly, no lymphadenopathy. Lungs:Clear bilaterally, no wheezes, rhonci, crackles Cardiovascular: Regular rate and rhythm. No murmurs, gallops or  rubs. Abdomen:Soft. Bowel sounds present. Non-tender.  Extremities: No lower extremity edema. Pulses are 2 + in the bilateral DP/PT.  EKG: NSR, rate 70 bpm.   Cardiac cath 8/09: 1. Ventriculography was done in the RAO projection. Overall systolic  function appeared preserved. No definite segmental abnormalities  or contraction were identified.  2. The left main does demonstrate a little bit of tenting at the  ostium. Multiple views were obtained, and there was good efflux of  contrast back into the aorta. High-grade obstruction is not noted,  but the tenting would represent probably about a 30% overall  luminal reduction. The left main itself appears to be smooth.  3. The left anterior descending artery courses to the apex. The first  tiny diagonal branch in the LAO view does demonstrate some  narrowing, probably of 90%, but this is a 1-mm vessel or less. The  second diagonal branch is a large-caliber vessel free of critical  disease in the mid and distal LAD. All appear free of significant  high-grade disease.  4. The ramus intermedius is a moderate-size vessel with minimal  proximal luminal irregularity.  5. The circumflex coronary artery provides 2 marginal branches and has  no significant obstruction.  6. The right coronary artery also was minimally tapered at the ostium.  The right coronary, however, is smooth throughout providing a  posterior descending and posterolateral branch with minimal or mild  plaquing at the origin of the PDA, which is not hemodynamically  significant.  Assessment and Plan:   1. CAD: Stable. No changes today. He has not been taking an ASA. He will start ASA 81 mg po Qdaily.   2. HTN: BP is well controlled at home. NO changes today. Continue current meds.   3. HLD: Lipids well controlled. Continue statin.

## 2013-02-09 ENCOUNTER — Other Ambulatory Visit: Payer: Self-pay | Admitting: Family Medicine

## 2013-02-28 ENCOUNTER — Other Ambulatory Visit: Payer: Self-pay | Admitting: Family Medicine

## 2013-04-03 ENCOUNTER — Encounter: Payer: Self-pay | Admitting: Family Medicine

## 2013-04-03 ENCOUNTER — Ambulatory Visit (INDEPENDENT_AMBULATORY_CARE_PROVIDER_SITE_OTHER): Payer: No Typology Code available for payment source | Admitting: Family Medicine

## 2013-04-03 VITALS — BP 128/74 | HR 65 | Temp 98.1°F | Ht 72.0 in | Wt 187.2 lb

## 2013-04-03 DIAGNOSIS — K4090 Unilateral inguinal hernia, without obstruction or gangrene, not specified as recurrent: Secondary | ICD-10-CM

## 2013-04-03 NOTE — Progress Notes (Signed)
   Date:  04/03/2013   Name:  Jeffery [name]   DOB:  04-Dec-1940   MRN:  [id number] Gender: male Age: 73 y.o.  Primary Physician:  Owens Loffler, MD   Chief Complaint: Hernia   Subjective:   History of Present Illness:  Jeffery [name] is a 73 y.o. very pleasant male patient who presents with the following:  Hernia giving some problem, hurting / discomfort.  Has grown in size some, some pain with climbing ladder for 8 hours a few weeks ago and doing a lot of painting.    Past Medical History, Surgical History, Social History, Family History, Problem List, Medications, and Allergies have been reviewed and updated if relevant.  Review of Systems:  GEN: No acute illnesses, no fevers, chills. GI: No n/v/d, eating normally Pulm: No SOB Interactive and getting along well at home.  Otherwise, ROS is as per the HPI.  Objective:   Physical Examination: BP 128/74  Pulse 65  Temp(Src) 98.1 F (36.7 C) (Oral)  Ht 6' (1.829 m)  Wt 187 lb 4 oz (84.936 kg)  BMI 25.39 kg/m2  SpO2 96%   GEN: WDWN, NAD, Non-toxic, Alert & Oriented x 3 HEENT: Atraumatic, Normocephalic.  Ears and Nose: No external deformity. EXTR: No clubbing/cyanosis/edema GU: normal male penis, hernia R inguinal canal, reducible.  NEURO: Normal gait.  PSYCH: Normally interactive. Conversant. Not depressed or anxious appearing.  Calm demeanor.   Laboratory and Imaging Data:  Assessment & Plan:   Right inguinal hernia  We discussed elective surgery, strangulation risk is low, and for now, he will continue to observe.  Signed,  Maud Deed. Miro Balderson, MD, [place] at [facility] [place] [address] Phone: [phone] Fax: [phone]  There are no Patient Instructions on file for this visit.  Patient's Medications  New Prescriptions   No medications on file  Previous Medications   AMLODIPINE (NORVASC) 10 MG TABLET    TAKE 1/2 TABLET BY MOUTH EVERY DAY     ASPIRIN 81 MG TABLET    Take 81 mg by mouth daily.   ATORVASTATIN (LIPITOR) 20 MG TABLET    TAKE 1 TABLET BY MOUTH EVERY DAY   FOLIC ACID (FOLVITE) 1 MG TABLET    TAKE 1 TABLET BY MOUTH DAILY   HYDROCHLOROTHIAZIDE (MICROZIDE) 12.5 MG CAPSULE    TAKE 1 CAPSULE BY MOUTH DAILY   LATANOPROST (XALATAN) 0.005 % OPHTHALMIC SOLUTION    Place 1 drop into the right eye at bedtime.    LOSARTAN (COZAAR) 100 MG TABLET    TAKE 1 TABLET BY MOUTH DAILY   MULTIPLE VITAMINS-MINERALS (CENTRUM PO)    Take 1 tablet by mouth daily.     SULFASALAZINE (AZULFIDINE) 500 MG TABLET    TAKE 2 TABLETS BY MOUTH TWICE A DAY  Modified Medications   No medications on file  Discontinued Medications   AZITHROMYCIN (ZITHROMAX) 250 MG TABLET    Take 2 tabs today, then 1 tab daily x 4 days   HYDROCODONE-HOMATROPINE (HYCODAN) 5-1.5 MG/5ML SYRUP    Take 5 mLs by mouth every 8 (eight) hours as needed for cough.

## 2013-04-03 NOTE — Progress Notes (Signed)
Pre visit review using our clinic review tool, if applicable. No additional management support is needed unless otherwise documented below in the visit note. 

## 2013-05-22 ENCOUNTER — Other Ambulatory Visit: Payer: Self-pay | Admitting: Internal Medicine

## 2013-07-31 ENCOUNTER — Other Ambulatory Visit: Payer: Self-pay | Admitting: Internal Medicine

## 2013-07-31 ENCOUNTER — Other Ambulatory Visit: Payer: Self-pay | Admitting: Family Medicine

## 2013-08-01 ENCOUNTER — Telehealth: Payer: Self-pay | Admitting: Internal Medicine

## 2013-08-01 MED ORDER — FOLIC ACID 1 MG PO TABS
ORAL_TABLET | ORAL | Status: DC
Start: 2013-08-01 — End: 2013-09-17

## 2013-08-01 NOTE — Telephone Encounter (Signed)
Rx sent 

## 2013-08-02 ENCOUNTER — Other Ambulatory Visit (INDEPENDENT_AMBULATORY_CARE_PROVIDER_SITE_OTHER): Payer: No Typology Code available for payment source

## 2013-08-02 DIAGNOSIS — Z79899 Other long term (current) drug therapy: Secondary | ICD-10-CM

## 2013-08-02 DIAGNOSIS — Z125 Encounter for screening for malignant neoplasm of prostate: Secondary | ICD-10-CM

## 2013-08-02 DIAGNOSIS — E785 Hyperlipidemia, unspecified: Secondary | ICD-10-CM

## 2013-08-02 DIAGNOSIS — I10 Essential (primary) hypertension: Secondary | ICD-10-CM

## 2013-08-02 DIAGNOSIS — Z0000 Encounter for general adult medical examination without abnormal findings: Secondary | ICD-10-CM

## 2013-08-02 LAB — LIPID PANEL
Cholesterol: 155 mg/dL (ref 0–200)
HDL: 48.1 mg/dL (ref >39.00)
LDL CALC: 93 mg/dL (ref 0–99)
NonHDL: 106.9
TRIGLYCERIDES: 70 mg/dL (ref 0.0–149.0)
Total CHOL/HDL Ratio: 3
VLDL: 14 mg/dL (ref 0.0–40.0)

## 2013-08-02 LAB — BASIC METABOLIC PANEL
BUN: 17 mg/dL (ref 6–23)
CALCIUM: 9.3 mg/dL (ref 8.4–10.5)
CO2: 24 mEq/L (ref 19–32)
CREATININE: 0.9 mg/dL (ref 0.4–1.5)
Chloride: 103 mEq/L (ref 96–112)
GFR: 90.31 mL/min (ref >60.00)
Glucose, Bld: 95 mg/dL (ref 70–99)
Potassium: 4.3 mEq/L (ref 3.5–5.1)
Sodium: 136 mEq/L (ref 135–145)

## 2013-08-02 LAB — TSH: TSH: 3.33 u[IU]/mL (ref 0.35–4.50)

## 2013-08-02 LAB — HEPATIC FUNCTION PANEL
ALK PHOS: 55 U/L (ref 39–117)
ALT: 24 U/L (ref 0–53)
AST: 27 U/L (ref 0–37)
Albumin: 4 g/dL (ref 3.5–5.2)
BILIRUBIN DIRECT: 0.2 mg/dL (ref 0.0–0.3)
BILIRUBIN TOTAL: 1.1 mg/dL (ref 0.2–1.2)
TOTAL PROTEIN: 6.1 g/dL (ref 6.0–8.3)

## 2013-08-02 LAB — CBC WITH DIFFERENTIAL/PLATELET
Basophils Absolute: 0 10*3/uL (ref 0.0–0.1)
Basophils Relative: 0.6 % (ref 0.0–3.0)
EOS ABS: 0.2 10*3/uL (ref 0.0–0.7)
Eosinophils Relative: 3.1 % (ref 0.0–5.0)
HEMATOCRIT: 39.7 % (ref 39.0–52.0)
HEMOGLOBIN: 13.1 g/dL (ref 13.0–17.0)
LYMPHS ABS: 1.7 10*3/uL (ref 0.7–4.0)
Lymphocytes Relative: 31.4 % (ref 12.0–46.0)
MCHC: 33 g/dL (ref 30.0–36.0)
MCV: 93.2 fl (ref 78.0–100.0)
MONO ABS: 0.8 10*3/uL (ref 0.1–1.0)
Monocytes Relative: 14.7 % — ABNORMAL HIGH (ref 3.0–12.0)
NEUTROS ABS: 2.7 10*3/uL (ref 1.4–7.7)
Neutrophils Relative %: 50.2 % (ref 43.0–77.0)
Platelets: 218 10*3/uL (ref 150.0–400.0)
RBC: 4.26 Mil/uL (ref 4.22–5.81)
RDW: 13.5 % (ref 11.5–15.5)
WBC: 5.4 10*3/uL (ref 4.0–10.5)

## 2013-08-02 LAB — PSA, MEDICARE: PSA: 1.03 ng/mL (ref 0.10–4.00)

## 2013-08-07 ENCOUNTER — Encounter: Payer: Self-pay | Admitting: Family Medicine

## 2013-08-07 ENCOUNTER — Ambulatory Visit (INDEPENDENT_AMBULATORY_CARE_PROVIDER_SITE_OTHER): Payer: No Typology Code available for payment source | Admitting: Family Medicine

## 2013-08-07 VITALS — BP 130/78 | HR 75 | Temp 98.2°F | Ht 70.0 in | Wt 182.2 lb

## 2013-08-07 DIAGNOSIS — Z23 Encounter for immunization: Secondary | ICD-10-CM

## 2013-08-07 DIAGNOSIS — I2510 Atherosclerotic heart disease of native coronary artery without angina pectoris: Secondary | ICD-10-CM

## 2013-08-07 DIAGNOSIS — E7800 Pure hypercholesterolemia, unspecified: Secondary | ICD-10-CM

## 2013-08-07 DIAGNOSIS — I10 Essential (primary) hypertension: Secondary | ICD-10-CM

## 2013-08-07 DIAGNOSIS — Z0000 Encounter for general adult medical examination without abnormal findings: Secondary | ICD-10-CM

## 2013-08-07 NOTE — Progress Notes (Signed)
[place] [address] Phone: [phone] Fax: [phone]  Patient ID: Jeffery [name] MRN: [id number], DOB: 09-11-1940, 73 y.o. Date of Encounter: 08/07/2013  Primary Physician:  Owens Loffler, MD   Chief Complaint: Annual Exam  Subjective:   History of Present Illness:  Jeffery [name] is a 73 y.o. pleasant patient who presents for a medicare wellness examination:  Preventative Health Maintenance Visit, Medicare Wellness and f/u of annual chronic issues.   Health Maintenance Summary Reviewed and updated, unless pt declines services.  Tobacco History Reviewed. Alcohol: No concerns, no excessive use Exercise Habits: Some activity, rec at least 30 mins 5 times a week STD concerns: no risk or activity to increase risk Drug Use: None Encouraged self-testicular check  Health Maintenance  Topic Date Due  . Influenza Vaccine  08/24/2013  . Colonoscopy  04/14/2016  . Tetanus/tdap  08/03/2022  . Pneumococcal Polysaccharide Vaccine Age 45 And Over  Completed  . Zostavax  Completed    Immunization History  Administered Date(s) Administered  . Influenza Split 11/10/2010, 10/03/2011  . Influenza Whole 12/29/2008  . Influenza,inj,Quad PF,36+ Mos 11/20/2012  . Pneumococcal Conjugate-13 08/07/2013  . Pneumococcal Polysaccharide-23 08/07/2008  . Tdap 08/02/2012  . Zoster 08/07/2008   Painful cramps in her legs, a couple of times a week. Ongoing now a long time. Basically, it does not correlate with any particular activity, liquid ingestion, solid ingestion, dehydration, really anything at all. He will just have some cramping once or twice a week.  Will get some hand cramps.   Coronary disease: He has no chest pain and no shortness of breath. His blood pressure is ago, he is on aspirin, 81 mg, Lipitor, hydrochlorothiazide, Norvasc, as well as an ARB. Basically no complaints otherwise in this regard.  Lipids: Doing well, stable. Tolerating meds fine with no  SE. Panel reviewed with patient.  Lipids:    Component Value Date/Time   CHOL 155 08/02/2013 1029   TRIG 70.0 08/02/2013 1029   HDL 48.10 08/02/2013 1029   LDLDIRECT 160.7 06/16/2008 0913   VLDL 14.0 08/02/2013 1029   CHOLHDL 3 08/02/2013 1029    Lab Results  Component Value Date   ALT 24 08/02/2013   AST 27 08/02/2013   ALKPHOS 55 08/02/2013   BILITOT 1.1 08/02/2013    HTN: Tolerating all medications without side effects Stable and at goal No CP, no sob. No HA.  BP Readings from Last 3 Encounters:  08/07/13 130/78  04/03/13 128/74  02/01/13 384/53    Basic Metabolic Panel:    Component Value Date/Time   NA 136 08/02/2013 1029   K 4.3 08/02/2013 1029   CL 103 08/02/2013 1029   CO2 24 08/02/2013 1029   BUN 17 08/02/2013 1029   CREATININE 0.9 08/02/2013 1029   GLUCOSE 95 08/02/2013 1029   CALCIUM 9.3 08/02/2013 1029      Prevnar-13.  Patient Active Problem List   Diagnosis Date Noted  . Right inguinal hernia 04/03/2013  . OTHER SPECIFIED FORMS OF HEARING LOSS 08/10/2009  . HYPERCHOLESTEROLEMIA  IIA 11/25/2008  . BENIGN PROSTATIC HYPERTROPHY, HX OF, S/P TURP 08/07/2008  . INTERNAL HEMORRHOIDS 05/14/2008  . ESOPHAGEAL MOTILITY DISORDER 05/14/2008  . ANAL FISTULA 05/14/2008  . COLONIC POLYPS, HYPERPLASTIC, HX OF 05/14/2008  . ESOPHAGITIS, HX OF 05/14/2008  . HYPERTENSION, BENIGN 05/06/2008  . CAD 05/06/2008  . CROHN'S DISEASE 05/06/2008   Past Medical History  Diagnosis Date  . Anal fistula   . History of esophagitis   .  Internal hemorrhoids   . Hyperplastic colon polyp   . Esophageal motility disorder   . CAD (coronary artery disease)   . Crohn disease   . Hyperlipidemia   . Hypertension   . Hearing loss   . History of BPH   . GERD (gastroesophageal reflux disease)    Past Surgical History  Procedure Laterality Date  . Eye surgery      right eye  . Hernia repair      x 2  . Lipoma excision      chest  . Prostate surgery      x 2, TURP   History    Social History  . Marital Status: Married    Spouse Name: N/A    Number of Children: 2  . Years of Education: N/A   Occupational History  . Retired-safety consulting    Social History Main Topics  . Smoking status: Former Smoker -- 1.00 packs/day for 20 years    Types: Cigarettes    Quit date: 01/24/1978  . Smokeless tobacco: Never Used  . Alcohol Use: 0.5 oz/week    1 drink(s) per week     Comment: occasional wine or beer weekly  . Drug Use: No  . Sexual Activity: Not on file   Other Topics Concern  . Not on file   Social History Narrative   Daily caffeine   Family History  Problem Relation Age of Onset  . Colon polyps Mother   . Colon cancer Neg Hx   . Esophageal cancer Neg Hx   . Rectal cancer Neg Hx   . Stomach cancer Neg Hx   . Heart attack Father 44   Allergies  Allergen Reactions  . Ace Inhibitors Other (See Comments)    REACTION: cough   Medication list has been reviewed and updated.  Review of Systems:  General: Denies fever, chills, sweats. No significant weight loss. Eyes: Denies blurring,significant itching ENT: Denies earache, sore throat, and hoarseness. Cardiovascular: Denies chest pains, palpitations, dyspnea on exertion Respiratory: Denies cough, dyspnea at rest,wheeezing Breast: no concerns about lumps GI: Crohn's has been fairly stable on current regiment. Sees Dr. Olevia Perches routinely GU: Denies penile discharge, ED, urinary flow / outflow problems. No STD concerns. Musculoskeletal: Denies back pain, joint pain - and as above. Derm: Denies rash, itching Neuro: Denies  paresthesias, frequent falls, frequent headaches Psych: Denies depression, anxiety Endocrine: Denies cold intolerance, heat intolerance, polydipsia Heme: Denies enlarged lymph nodes Allergy: No hayfever  Objective:   Physical Examination: BP 130/78  Pulse 75  Temp(Src) 98.2 F (36.8 C) (Oral)  Ht 5' 10"  (1.778 m)  Wt 182 lb 4 oz (82.668 kg)  BMI 26.15 kg/m2  The  patient completed a fall screen and PHQ-2 and PHQ-9 if necessary, which is documented in the EHR. The CMA/LPN/RN who assisted the patient verbally completed with them and documented results in [facility].   Hearing Screening   Method: Audiometry   125Hz  250Hz  500Hz  1000Hz  2000Hz  4000Hz  8000Hz   Right ear:         Left ear:         Comments: Wears Bilateral Hearing Aides  Vision Screening Comments: Wears [place] [facility] at [facility] Jun 03, 2013.  GEN: well developed, well nourished, no acute distress Eyes: conjunctiva and lids normal, PERRLA, EOMI ENT: TM clear, nares clear, oral exam WNL Neck: supple, no lymphadenopathy, no thyromegaly, no JVD Pulm: clear to auscultation and percussion, respiratory effort normal CV: regular rate and rhythm,  S1-S2, no murmur, rub or gallop, no bruits, peripheral pulses normal and symmetric, no cyanosis, clubbing, edema or varicosities GI: soft, non-tender; no hepatosplenomegaly, masses; active bowel sounds all quadrants GU: no hernia, testicular mass, penile discharge Lymph: no cervical, axillary or inguinal adenopathy MSK: gait normal, muscle tone and strength WNL, no joint swelling, effusions, discoloration, crepitus  SKIN: clear, good turgor, color WNL, no rashes, lesions, or ulcerations Neuro: normal mental status, normal strength, sensation, and motion Psych: alert; oriented to person, place and time, normally interactive and not anxious or depressed in appearance.  All labs reviewed with patient.  Lipids:    Component Value Date/Time   CHOL 155 08/02/2013 1029   TRIG 70.0 08/02/2013 1029   HDL 48.10 08/02/2013 1029   LDLDIRECT 160.7 06/16/2008 0913   VLDL 14.0 08/02/2013 1029   CHOLHDL 3 08/02/2013 1029   CBC: CBC Latest Ref Rng 08/02/2013 07/24/2012 06/17/2010  WBC 4.0 - 10.5 K/uL 5.4 6.6 4.8  Hemoglobin 13.0 - 17.0 g/dL 13.1 13.6 13.4  Hematocrit 39.0 - 52.0 % 39.7 40.9 39.4  Platelets 150.0 - 400.0 K/uL 218.0 208.0 208.0     Basic Metabolic Panel:    Component Value Date/Time   NA 136 08/02/2013 1029   K 4.3 08/02/2013 1029   CL 103 08/02/2013 1029   CO2 24 08/02/2013 1029   BUN 17 08/02/2013 1029   CREATININE 0.9 08/02/2013 1029   GLUCOSE 95 08/02/2013 1029   CALCIUM 9.3 08/02/2013 1029   Hepatic Function Latest Ref Rng 08/02/2013 07/24/2012 06/06/2011  Total Protein 6.0 - 8.3 g/dL 6.1 6.5 6.6  Albumin 3.5 - 5.2 g/dL 4.0 4.1 3.9  AST 0 - 37 U/L 27 29 26   ALT 0 - 53 U/L 24 23 25   Alk Phosphatase 39 - 117 U/L 55 50 49  Total Bilirubin 0.2 - 1.2 mg/dL 1.1 1.2 1.3(H)  Bilirubin, Direct 0.0 - 0.3 mg/dL 0.2 0.2 0.1    Lab Results  Component Value Date   TSH 3.33 08/02/2013   Lab Results  Component Value Date   PSA 1.03 08/02/2013   PSA 1.01 07/24/2012   PSA 1.17 10/03/2011    Assessment & Plan:  Routine general medical examination at a health care facility  Need for prophylactic vaccination against Streptococcus pneumoniae (pneumococcus) - Plan: Pneumococcal conjugate vaccine 13-valent  CAD: very stable with medical management.  HYPERTENSION, BENIGN: stable and at goal  HYPERCHOLESTEROLEMIA  IIA: stable and at goal  Health Maintenance Exam: The patient's preventative maintenance and recommended screening tests for an annual wellness exam were reviewed in full today. Brought up to date unless services declined.  Counselled on the importance of diet, exercise, and its role in overall health and mortality. The patient's FH and SH was reviewed, including their home life, tobacco status, and drug and alcohol status.  I have personally reviewed the Medicare Annual Wellness questionnaire and have noted 1. The patient's medical and social history 2. Their use of alcohol, tobacco or illicit drugs 3. Their current medications and supplements 4. The patient's functional ability including ADL's, fall risks, home safety risks and hearing or visual             impairment. 5. Diet and physical  activities 6. Evidence for depression or mood disorders  The patients weight, height, BMI and visual acuity have been recorded in the chart I have made referrals, counseling and provided education to the patient based review of the above and I have provided the pt with a written  personalized care plan for preventive services.  I have provided the patient with a copy of your personalized plan for preventive services. Instructed to take the time to review along with their updated medication list.  Follow-up: No Follow-up on file. Or follow-up in 1 year for complete physical examination  New Prescriptions   No medications on file   Modified Medications   No medications on file   Orders Placed This Encounter  Procedures  . Pneumococcal conjugate vaccine 13-valent    Signed,  Maveric Debono T. Commodore Bellew, MD, [facility] Sports [facility]   Discontinued Medications   No medications on file   Current Medications at Discharge:   Medication List       This list is accurate as of: 08/07/13 11:59 PM.  Always use your most recent med list.               amLODipine 10 MG tablet  Commonly known as:  NORVASC  TAKE 1/2 TABLET BY MOUTH EVERY DAY     aspirin 81 MG tablet  Take 81 mg by mouth daily.     atorvastatin 20 MG tablet  Commonly known as:  LIPITOR  TAKE 1 TABLET BY MOUTH EVERY DAY     CENTRUM PO  Take 1 tablet by mouth daily.     folic acid 1 MG tablet  Commonly known as:  FOLVITE  TAKE 1 TABLET BY MOUTH DAILY     hydrochlorothiazide 12.5 MG capsule  Commonly known as:  MICROZIDE  TAKE 1 CAPSULE BY MOUTH DAILY     latanoprost 0.005 % ophthalmic solution  Commonly known as:  XALATAN  Place 1 drop into the right eye at bedtime.     losartan 100 MG tablet  Commonly known as:  COZAAR  TAKE 1 TABLET BY MOUTH DAILY     sulfaSALAzine 500 MG tablet  Commonly known as:  AZULFIDINE  TAKE 2 TABLETS BY MOUTH TWICE A DAY

## 2013-08-13 ENCOUNTER — Other Ambulatory Visit: Payer: Self-pay | Admitting: Family Medicine

## 2013-08-13 ENCOUNTER — Other Ambulatory Visit: Payer: Self-pay | Admitting: Internal Medicine

## 2013-08-29 ENCOUNTER — Other Ambulatory Visit: Payer: Self-pay | Admitting: Family Medicine

## 2013-09-17 ENCOUNTER — Encounter: Payer: Self-pay | Admitting: Internal Medicine

## 2013-09-17 ENCOUNTER — Ambulatory Visit (INDEPENDENT_AMBULATORY_CARE_PROVIDER_SITE_OTHER): Payer: No Typology Code available for payment source | Admitting: Internal Medicine

## 2013-09-17 VITALS — BP 100/60 | HR 80 | Ht 70.0 in | Wt 186.0 lb

## 2013-09-17 DIAGNOSIS — K5090 Crohn's disease, unspecified, without complications: Secondary | ICD-10-CM

## 2013-09-17 MED ORDER — SULFASALAZINE 500 MG PO TABS: ORAL_TABLET | ORAL | Status: DC

## 2013-09-17 MED ORDER — FOLIC ACID 1 MG PO TABS: ORAL_TABLET | ORAL | Status: DC

## 2013-09-17 NOTE — Patient Instructions (Signed)
We have sent the following medications to your pharmacy for you to pick up at your convenience: Sulfasalazine Folic acid  You will be due for a recall colonoscopy in 01/2014. We will send you a reminder in the mail when it gets closer to that time.  CC:Dr Spencer Copland

## 2013-09-17 NOTE — Progress Notes (Signed)
Jeffery [name] 09/25/1940 [id number]  Note: This dictation was prepared with Dragon digital system. Any transcriptional errors that result from this procedure are unintentional.   History of Present Illness:  This is a 73 year old white male last seen in July 2014. He was diagnosed with granulomatous colitis in 1986 on a flexible sigmoidoscopy which showed granulomas. He had follow up colonoscopies in 1989, 1994, and 1999, all showing granulomas in the left colon. His last colonoscopy was in 2007. His last flareup was in 2002. He has been on a maintenance dose of sulfasalazine 1 g twice a day and folic acid 1 mg daily. He has regular bowel habits. He denies abdominal pain or rectal bleeding. He has occasional sensation of fullness in the left middle quadrant which has been there off and on for many years and we have never been able to pinpoint the etiology of that. His weight has been stable. He will be due for a recall colonoscopy in the next few months. He has developed a right inguinal hernia.    Past Medical History  Diagnosis Date  . Anal fistula   . History of esophagitis   . Internal hemorrhoids   . Hyperplastic colon polyp   . Esophageal motility disorder   . CAD (coronary artery disease)   . Crohn disease   . Hyperlipidemia   . Hypertension   . Hearing loss   . History of BPH   . GERD (gastroesophageal reflux disease)     Past Surgical History  Procedure Laterality Date  . Eye surgery      right eye  . Hernia repair      x 2  . Lipoma excision      chest  . Prostate surgery      x 2, TURP    Allergies  Allergen Reactions  . Ace Inhibitors Other (See Comments)    REACTION: cough    Family history and social history have been reviewed.  Review of Systems: Denies rectal bleeding weight loss dysphagia  The remainder of the 10 point ROS is negative except as outlined in the H&P  Physical Exam: General Appearance Well developed, in no distress Eyes  Non icteric   HEENT  Non traumatic, normocephalic  Mouth No lesion, tongue papillated, no cheilosis Neck Supple without adenopathy, thyroid not enlarged, no carotid bruits, no JVD Lungs Clear to auscultation bilaterally COR Normal S1, normal S2, regular rhythm, no murmur, quiet precordium Abdomen soft nontender. Small rectus diastasis, Fullness in right groin consistent with a right inguinal hernia nonreducible. Nontender. Extending 2 right inguinal canal and partly into the scrotum. He had left inguinal hernia repair in the past there is just a scar but no recurrence Rectal soft Hemoccult negative stool Extremities  No pedal edema Skin No lesions, submucosal lipoma thin abdominal wall Neurological Alert and oriented x 3 Psychological Normal mood and affect  Assessment and Plan:   Problem #73 73 year old white male with Crohn's colitis in remission. He is on a maintenance dose of sulfasalazine. He will be due for a recall colonoscopy in January 2016. We will refill his sulfasalazine and folic acid today.  Problem #2 Right inguinal hernia, currently asymptomatic.    Jeffery [name] 09/17/2013

## 2013-10-11 ENCOUNTER — Other Ambulatory Visit: Payer: Self-pay | Admitting: Family Medicine

## 2013-11-15 ENCOUNTER — Encounter: Payer: Self-pay | Admitting: Family Medicine

## 2013-11-15 ENCOUNTER — Ambulatory Visit (INDEPENDENT_AMBULATORY_CARE_PROVIDER_SITE_OTHER): Payer: No Typology Code available for payment source | Admitting: Family Medicine

## 2013-11-15 VITALS — BP 120/66 | HR 70 | Temp 98.0°F | Ht 70.0 in | Wt 182.2 lb

## 2013-11-15 DIAGNOSIS — B9789 Other viral agents as the cause of diseases classified elsewhere: Principal | ICD-10-CM

## 2013-11-15 DIAGNOSIS — J069 Acute upper respiratory infection, unspecified: Secondary | ICD-10-CM

## 2013-11-15 MED ORDER — AZITHROMYCIN 250 MG PO TABS: ORAL_TABLET | ORAL | Status: DC

## 2013-11-15 MED ORDER — GUAIFENESIN-CODEINE 100-10 MG/5ML PO SYRP: 5.0000 mL | ORAL_SOLUTION | Freq: Every evening | ORAL | Status: DC | PRN

## 2013-11-15 NOTE — Progress Notes (Signed)
Pre visit review using our clinic review tool, if applicable. No additional management support is needed unless otherwise documented below in the visit note. 

## 2013-11-15 NOTE — Assessment & Plan Note (Signed)
Treat symptomatically,  But given age and history of smoking if not turning the corner in 4-5 days fill rx for Zpak.

## 2013-11-15 NOTE — Patient Instructions (Signed)
Continue nasal saline. Avoid decongestants. Start mucinex DM during the day. Use codeine cough suppressant at night.  If not improving in next 4-5 days can fill prescription for antibiotics.

## 2013-11-15 NOTE — Progress Notes (Signed)
   Subjective:    Patient ID: Jeffery [name], male    DOB: [birth year], 73 y.o.   MRN: [id number]  Cough This is a new problem. The current episode started in the past 7 days. The problem has been gradually worsening. The cough is productive of sputum. Associated symptoms include ear congestion, headaches, nasal congestion and a sore throat. Pertinent negatives include no chest pain, chills, ear pain, fever, postnasal drip, rash, shortness of breath or wheezing. Associated symptoms comments: Sinus pressure. The symptoms are aggravated by lying down (keeping him up at night). Risk factors for lung disease include smoking/tobacco exposure (remote smoker 8 pack year history). Treatments tried: nasal saline irrigation, advil. The treatment provided mild relief. His past medical history is significant for bronchitis. There is no history of asthma, bronchiectasis, COPD, emphysema or environmental allergies. had bronchitis last year, he is worried this will become that      Review of Systems  Constitutional: Negative for fever and chills.  HENT: Positive for sore throat. Negative for ear pain and postnasal drip.   Respiratory: Positive for cough. Negative for shortness of breath and wheezing.   Cardiovascular: Negative for chest pain.  Skin: Negative for rash.  Allergic/Immunologic: Negative for environmental allergies.  Neurological: Positive for headaches.       Objective:   Physical Exam  Constitutional: Vital signs are normal. He appears well-developed and well-nourished.  Non-toxic appearance. He does not appear ill. No distress.  HENT:  Head: Normocephalic and atraumatic.  Right Ear: Hearing, tympanic membrane, external ear and ear canal normal. No tenderness. No foreign bodies. Tympanic membrane is not retracted and not bulging.  Left Ear: Hearing, tympanic membrane, external ear and ear canal normal. No tenderness. No foreign bodies. Tympanic membrane is not retracted and not bulging.    Nose: Nose normal. No mucosal edema or rhinorrhea. Right sinus exhibits no maxillary sinus tenderness and no frontal sinus tenderness. Left sinus exhibits no maxillary sinus tenderness and no frontal sinus tenderness.  Mouth/Throat: Uvula is midline, oropharynx is clear and moist and mucous membranes are normal. Normal dentition. No dental caries. No oropharyngeal exudate or tonsillar abscesses.  Eyes: Conjunctivae, EOM and lids are normal. Pupils are equal, round, and reactive to light. Lids are everted and swept, no foreign bodies found.  Neck: Trachea normal, normal range of motion and phonation normal. Neck supple. Carotid bruit is not present. No mass and no thyromegaly present.  Cardiovascular: Normal rate, regular rhythm, S1 normal, S2 normal, normal heart sounds, intact distal pulses and normal pulses.  Exam reveals no gallop.   No murmur heard. Pulmonary/Chest: Effort normal and breath sounds normal. No respiratory distress. He has no wheezes. He has no rhonchi. He has no rales.  Abdominal: Soft. Normal appearance and bowel sounds are normal. There is no hepatosplenomegaly. There is no tenderness. There is no rebound, no guarding and no CVA tenderness. No hernia.  Neurological: He is alert. He has normal reflexes.  Skin: Skin is warm, dry and intact. No rash noted.  Psychiatric: He has a normal mood and affect. His speech is normal and behavior is normal. Judgment normal.          Assessment & Plan:  Return for flu shot at later date.

## 2013-12-02 ENCOUNTER — Telehealth: Payer: Self-pay | Admitting: Internal Medicine

## 2013-12-02 MED ORDER — SULFASALAZINE 500 MG PO TABS: ORAL_TABLET | ORAL | Status: DC

## 2013-12-02 NOTE — Telephone Encounter (Signed)
#  360 with 2 additional refills of sulfasalazine were originally faxed to [facility] when he came for office visit. I have authorized one additional #360 tablet rx to go to [facility].

## 2013-12-25 ENCOUNTER — Ambulatory Visit (INDEPENDENT_AMBULATORY_CARE_PROVIDER_SITE_OTHER): Payer: No Typology Code available for payment source

## 2013-12-25 DIAGNOSIS — Z23 Encounter for immunization: Secondary | ICD-10-CM

## 2014-02-14 ENCOUNTER — Ambulatory Visit: Payer: No Typology Code available for payment source | Admitting: Cardiovascular Disease

## 2014-02-17 ENCOUNTER — Ambulatory Visit: Payer: No Typology Code available for payment source | Admitting: Cardiovascular Disease

## 2014-02-21 ENCOUNTER — Ambulatory Visit (INDEPENDENT_AMBULATORY_CARE_PROVIDER_SITE_OTHER): Payer: No Typology Code available for payment source | Admitting: Cardiovascular Disease

## 2014-02-21 ENCOUNTER — Other Ambulatory Visit: Payer: Self-pay

## 2014-02-21 ENCOUNTER — Encounter: Payer: Self-pay | Admitting: Cardiovascular Disease

## 2014-02-21 VITALS — BP 128/82 | HR 61 | Ht 70.0 in | Wt 180.0 lb

## 2014-02-21 DIAGNOSIS — E785 Hyperlipidemia, unspecified: Secondary | ICD-10-CM

## 2014-02-21 DIAGNOSIS — R0609 Other forms of dyspnea: Secondary | ICD-10-CM

## 2014-02-21 DIAGNOSIS — I10 Essential (primary) hypertension: Secondary | ICD-10-CM

## 2014-02-21 DIAGNOSIS — I2510 Atherosclerotic heart disease of native coronary artery without angina pectoris: Secondary | ICD-10-CM

## 2014-02-21 MED ORDER — HYDROCHLOROTHIAZIDE 12.5 MG PO CAPS: 12.5000 mg | ORAL_CAPSULE | Freq: Every day | ORAL | Status: DC

## 2014-02-21 NOTE — Telephone Encounter (Signed)
Pt left v/m requesting refill HCTZ to [facility]. Advised pt done.

## 2014-02-21 NOTE — Patient Instructions (Signed)
Your physician wants you to follow-up in:  12 months.  You will receive a reminder letter in the mail two months in advance. If you don't receive a letter, please call our office to schedule the follow-up appointment.  Your physician has requested that you have an exercise stress myoview. For further information please visit [email or web address]. Please follow instruction sheet, as given.

## 2014-02-21 NOTE — Progress Notes (Signed)
History of Present Illness: 74 yo male with history of CAD, HTN, HLD here today for cardiac follow up. He has been followed in the past by Dr. Lia Foyer. He presented with chest pain in August 2009. Cardiac cath with mild non-obstructive disease. He has Crohn's disease in remission.   He is here today for cardiac follow up. He describes occasional chest pains at rest that last for several hours. Also exertional dyspnea. He is very active.   Primary Care Physician: Frederico Hamman Copland  Last Lipid Profile:Lipid Panel     Component Value Date/Time   CHOL 155 08/02/2013 1029   TRIG 70.0 08/02/2013 1029   HDL 48.10 08/02/2013 1029   CHOLHDL 3 08/02/2013 1029   VLDL 14.0 08/02/2013 1029   [place] 93 08/02/2013 1029     Past Medical History  Diagnosis Date  . Anal fistula   . History of esophagitis   . Internal hemorrhoids   . Hyperplastic colon polyp   . Esophageal motility disorder   . CAD (coronary artery disease)   . Crohn disease   . Hyperlipidemia   . Hypertension   . Hearing loss   . History of BPH   . GERD (gastroesophageal reflux disease)     Past Surgical History  Procedure Laterality Date  . Eye surgery      right eye  . Hernia repair      x 2  . Lipoma excision      chest  . Prostate surgery      x 2, TURP    Current Outpatient Prescriptions  Medication Sig Dispense Refill  . amLODipine (NORVASC) 10 MG tablet TAKE 1/2 TABLET BY MOUTH EVERY DAY    . aspirin 81 MG tablet Take 81 mg by mouth daily.    Marland Kitchen atorvastatin (LIPITOR) 20 MG tablet TAKE 1 TABLET BY MOUTH EVERY DAY 90 tablet 3  . folic acid (FOLVITE) 1 MG tablet TAKE 1 TABLET BY MOUTH DAILY 100 tablet 3  . hydrochlorothiazide (MICROZIDE) 12.5 MG capsule TAKE 1 CAPSULE BY MOUTH DAILY 90 capsule 1  . latanoprost (XALATAN) 0.005 % ophthalmic solution Place 1 drop into the right eye at bedtime.     Marland Kitchen losartan (COZAAR) 100 MG tablet TAKE 1 TABLET BY MOUTH DAILY 90 tablet 1  . Multiple Vitamins-Minerals  (CENTRUM PO) Take 1 tablet by mouth daily.      Marland Kitchen sulfaSALAzine (AZULFIDINE) 500 MG tablet TAKE 2 TABLETS BY MOUTH TWICE A DAY 360 tablet 0   No current facility-administered medications for this visit.    Allergies  Allergen Reactions  . Ace Inhibitors Other (See Comments)    REACTION: cough    History   Social History  . Marital Status: Married    Spouse Name: N/A    Number of Children: 2  . Years of Education: N/A   Occupational History  . Retired-safety consulting    Social History Main Topics  . Smoking status: Former Smoker -- 1.00 packs/day for 20 years    Types: Cigarettes    Quit date: 01/24/1978  . Smokeless tobacco: Never Used  . Alcohol Use: 0.5 oz/week    1 drink(s) per week     Comment: occasional wine or beer weekly  . Drug Use: No  . Sexual Activity: Not on file   Other Topics Concern  . Not on file   Social History Narrative   Daily caffeine    Family History  Problem Relation Age of Onset  . Colon  polyps Mother   . Colon cancer Neg Hx   . Esophageal cancer Neg Hx   . Rectal cancer Neg Hx   . Stomach cancer Neg Hx   . Heart attack Father 84    Review of Systems:  As stated in the HPI and otherwise negative.   BP 128/82 mmHg  Pulse 61  Ht 5' 10"  (1.778 m)  Wt 180 lb (81.647 kg)  BMI 25.83 kg/m2  Physical Examination: General: Well developed, well nourished, NAD HEENT: OP clear, mucus membranes moist SKIN: warm, dry. No rashes. Neuro: No focal deficits Musculoskeletal: Muscle strength 5/5 all ext Psychiatric: Mood and affect normal Neck: No JVD, no carotid bruits, no thyromegaly, no lymphadenopathy. Lungs:Clear bilaterally, no wheezes, rhonci, crackles Cardiovascular: Regular rate and rhythm. No murmurs, gallops or rubs. Abdomen:Soft. Bowel sounds present. Non-tender.  Extremities: No lower extremity edema. Pulses are 2 + in the bilateral DP/PT.  EKG: NSR, rate 61 bpm.   Cardiac cath 8/09: 1. Ventriculography was done in the  RAO projection. Overall systolic  function appeared preserved. No definite segmental abnormalities  or contraction were identified.  2. The left main does demonstrate a little bit of tenting at the  ostium. Multiple views were obtained, and there was good efflux of  contrast back into the aorta. High-grade obstruction is not noted,  but the tenting would represent probably about a 30% overall  luminal reduction. The left main itself appears to be smooth.  3. The left anterior descending artery courses to the apex. The first  tiny diagonal branch in the LAO view does demonstrate some  narrowing, probably of 90%, but this is a 1-mm vessel or less. The  second diagonal branch is a large-caliber vessel free of critical  disease in the mid and distal LAD. All appear free of significant  high-grade disease.  4. The ramus intermedius is a moderate-size vessel with minimal  proximal luminal irregularity.  5. The circumflex coronary artery provides 2 marginal branches and has  no significant obstruction.  6. The right coronary artery also was minimally tapered at the ostium.  The right coronary, however, is smooth throughout providing a  posterior descending and posterolateral branch with minimal or mild  plaquing at the origin of the PDA, which is not hemodynamically  significant.  Assessment and Plan:   1. CAD: He has had recent resting chest pain and dyspnea on exertion. Will arrange exercise stress myoview to exclude ischemia. No recent stress testing. Will continue ASA, statin, ARB.   2. HTN: BP is well controlled. No changes today. Continue current meds.   3. HLD: Lipids well controlled. Continue statin.

## 2014-03-07 ENCOUNTER — Ambulatory Visit (HOSPITAL_COMMUNITY): Payer: Medicare Other | Attending: Cardiovascular Disease | Admitting: Radiology

## 2014-03-07 DIAGNOSIS — R0609 Other forms of dyspnea: Secondary | ICD-10-CM

## 2014-03-07 DIAGNOSIS — I2510 Atherosclerotic heart disease of native coronary artery without angina pectoris: Secondary | ICD-10-CM | POA: Diagnosis present

## 2014-03-07 DIAGNOSIS — I10 Essential (primary) hypertension: Secondary | ICD-10-CM

## 2014-03-07 MED ORDER — TECHNETIUM TC 99M SESTAMIBI GENERIC - CARDIOLITE
30.0000 | Freq: Once | INTRAVENOUS | Status: AC | PRN
  Administered 2014-03-07: 30 via INTRAVENOUS

## 2014-03-07 MED ORDER — TECHNETIUM TC 99M SESTAMIBI GENERIC - CARDIOLITE
10.0000 | Freq: Once | INTRAVENOUS | Status: AC | PRN
  Administered 2014-03-07: 10 via INTRAVENOUS

## 2014-03-07 NOTE — Progress Notes (Signed)
[facility] [place], [place] [address] [phone]    Cardiology Nuclear Med Study  Jeffery [name] is a 74 y.o. male     MRN : [id number]     DOB: January 28, 1940  Procedure Date: 03/07/2014  Nuclear Med Background Indication for Stress Test:  Evaluation for Ischemia History:CAD:Previous Nuclear Study 12' EF:57%(low risk) Cardiac Risk Factors: Hypertension  Symptoms:  Chest Pain and DOE   Nuclear Pre-Procedure Caffeine/Decaff Intake:  None NPO After: 7:00pm   Lungs:  clear O2 Sat: 95% on room air. IV 0.9% NS with Angio Cath:  20g  IV Site: R Antecubital  IV Started by:  Ileene Hutchinson, EMT-P  Chest Size (in):  42 Cup Size: n/a  Height: 5' 10"  (1.778 m)  Weight:  178 lb (80.74 kg)  BMI:  Body mass index is 25.54 kg/(m^2). Tech Comments:  n/a    Nuclear Med Study 1 or 2 day study: 1 day  Stress Test Type:  Stress  Reading MD: n/a  Order Authorizing Provider:  [place]  Resting Radionuclide: Technetium 96mSestamibi  Resting Radionuclide Dose: 11.0 mCi   Stress Radionuclide:  Technetium 946mestamibi  Stress Radionuclide Dose: 33.0 mCi           Stress Protocol Rest HR: 58 Stress HR: 136  Rest BP: 151/84 Stress BP: 194/91  Exercise Time (min): 7:03 METS: 8.60   Predicted Max HR: 147 bpm % Max HR: 92.52 bpm Rate Pressure Product: [id number] Dose of Adenosine (mg):  n/a Dose of Lexiscan: n/a mg  Dose of Atropine (mg): n/a Dose of Dobutamine: n/a mcg/kg/min (at max HR)  Stress Test Technologist: TeIleene HutchinsonEMT-P  Nuclear Technologist:  ElEarl ManyCNMT     Rest Procedure:  Myocardial perfusion imaging was performed at rest 45 minutes following the intravenous administration of Technetium 9943mstamibi. Rest ECG: NSR - Normal EKG   Stress Procedure:  The patient exercised on the treadmill utilizing the Bruce Protocol for 7:03 minutes. The patient stopped due to sob and denied any chest pain.  Technetium 88m28mtamibi was  injected at peak exercise and myocardial perfusion imaging was performed after a brief delay. Stress ECG: No significant change from baseline ECG  QPS Raw Data Images:  Normal; no motion artifact; normal heart/lung ratio. Stress Images:  Normal homogeneous uptake in all areas of the myocardium. Rest Images:  Normal homogeneous uptake in all areas of the myocardium. Subtraction (SDS):  No evidence of ischemia. Transient Ischemic Dilatation (Normal <1.22):  0.81 Lung/Heart Ratio (Normal <0.45):  0.28  Quantitative Gated Spect Images QGS EDV:  105 ml QGS ESV:  48 ml  Impression Exercise Capacity:  Good exercise capacity. BP Response:  Normal blood pressure response. Clinical Symptoms:  No chest pain. ECG Impression:  No significant ST segment change suggestive of ischemia. Comparison with Prior Nuclear Study: No images to compare  Overall Impression:  Normal stress nuclear study.  LV Ejection Fraction: 55%.  LV Wall Motion:  NL LV Function; NL Wall Motion  [name]

## 2014-03-24 ENCOUNTER — Other Ambulatory Visit: Payer: Self-pay | Admitting: *Deleted

## 2014-03-24 MED ORDER — LOSARTAN POTASSIUM 100 MG PO TABS: 100.0000 mg | ORAL_TABLET | Freq: Every day | ORAL | Status: DC

## 2014-04-04 ENCOUNTER — Other Ambulatory Visit: Payer: Self-pay

## 2014-04-04 MED ORDER — AMLODIPINE BESYLATE 10 MG PO TABS: 5.0000 mg | ORAL_TABLET | Freq: Every day | ORAL | Status: DC

## 2014-04-04 NOTE — Telephone Encounter (Signed)
Pt left /vm requesting refill amlodipine to [facility]; pt last CPX 08/07/13; hx med list appears refilled in 2014 for 1 year. Is OK to refill?

## 2014-06-25 ENCOUNTER — Other Ambulatory Visit: Payer: Self-pay

## 2014-06-25 MED ORDER — LOSARTAN POTASSIUM 100 MG PO TABS: 100.0000 mg | ORAL_TABLET | Freq: Every day | ORAL | Status: DC

## 2014-06-25 NOTE — Telephone Encounter (Signed)
Pt left v/m requesting refill losartan to [facility]. Pt last annual exam 08/07/13 with no future appt scheduled. Last refilled #90 on 03/24/14 and per protocol sending note to PCP.Please advise.

## 2014-07-01 ENCOUNTER — Ambulatory Visit (INDEPENDENT_AMBULATORY_CARE_PROVIDER_SITE_OTHER): Payer: Medicare Other | Admitting: Family Medicine

## 2014-07-01 ENCOUNTER — Encounter: Payer: Self-pay | Admitting: Family Medicine

## 2014-07-01 VITALS — BP 110/60 | HR 78 | Temp 98.5°F | Ht 70.0 in | Wt 181.5 lb

## 2014-07-01 DIAGNOSIS — R1032 Left lower quadrant pain: Secondary | ICD-10-CM | POA: Diagnosis not present

## 2014-07-01 DIAGNOSIS — K50919 Crohn's disease, unspecified, with unspecified complications: Secondary | ICD-10-CM

## 2014-07-01 NOTE — Progress Notes (Signed)
Dr. Frederico Hamman T. Shalicia Craghead, MD, [facility] [place], [address] Phone: [phone] Fax: [phone]  07/01/2014  Patient: Jeffery [name], MRN: [id number], DOB: [birth year], 74 y.o.  Primary Physician:  Owens Loffler, MD  Chief Complaint: Abdominal Pain  Subjective:   Jeffery [name] is a 74 y.o. very pleasant male patient who presents with the following:  Pt with Crohn's, pt of Dr. Olevia Perches: LLQ pain, thought might want to get it checked out. Does not have typical crohns appearance. Some churning. His Crohn's is essentially been pretty stable for a long time. He is still unable to eat and drink. He has had a little bit a change in his stool and had some stool that is been more watery. He has had some more churning in his belly. He also has had some discomfort in his left lower quadrant for about 2 weeks.  I reviewed his most recent colonoscopy, no diverticulosis was noted. He has no known history of diverticulitis in the past.  Has not had any fever at all.   Patient Active Problem List   Diagnosis Date Noted  . OTHER SPECIFIED FORMS OF HEARING LOSS 08/10/2009  . HYPERCHOLESTEROLEMIA  IIA 11/25/2008  . BENIGN PROSTATIC HYPERTROPHY, HX OF, S/P TURP 08/07/2008  . INTERNAL HEMORRHOIDS 05/14/2008  . ESOPHAGEAL MOTILITY DISORDER 05/14/2008  . ANAL FISTULA 05/14/2008  . COLONIC POLYPS, HYPERPLASTIC, HX OF 05/14/2008  . ESOPHAGITIS, HX OF 05/14/2008  . HYPERTENSION, BENIGN 05/06/2008  . CAD 05/06/2008  . Crohn's disease 05/06/2008    Past Medical History  Diagnosis Date  . Anal fistula   . History of esophagitis   . Internal hemorrhoids   . Hyperplastic colon polyp   . Esophageal motility disorder   . CAD (coronary artery disease)   . Crohn disease   . Hyperlipidemia   . Hypertension   . Hearing loss   . History of BPH   . GERD (gastroesophageal reflux disease)     Past Surgical History  Procedure Laterality Date    . Eye surgery      right eye  . Hernia repair      x 2  . Lipoma excision      chest  . Prostate surgery      x 2, TURP    History   Social History  . Marital Status: Married    Spouse Name: N/A  . Number of Children: 2  . Years of Education: N/A   Occupational History  . Retired-safety consulting    Social History Main Topics  . Smoking status: Former Smoker -- 1.00 packs/day for 20 years    Types: Cigarettes    Quit date: 01/24/1978  . Smokeless tobacco: Never Used  . Alcohol Use: 0.5 oz/week    1 drink(s) per week     Comment: occasional wine or beer weekly  . Drug Use: No  . Sexual Activity: Not on file   Other Topics Concern  . Not on file   Social History Narrative   Daily caffeine    Family History  Problem Relation Age of Onset  . Colon polyps Mother   . Colon cancer Neg Hx   . Esophageal cancer Neg Hx   . Rectal cancer Neg Hx   . Stomach cancer Neg Hx   . Heart attack Father 41    Allergies  Allergen Reactions  . Ace Inhibitors Other (See Comments)    REACTION: cough  Medication list reviewed and updated in full in [place].   Past Medical History, Surgical History, Social History, Family History, Problem List, Medications, and Allergies have been reviewed and updated if relevant.  ROS: GEN: Acute illness details above GI: Tolerating PO intake GU: maintaining adequate hydration and urination Pulm: No SOB Interactive and getting along well at home.  Otherwise, ROS is as per the HPI.   Objective:   BP 110/60 mmHg  Pulse 78  Temp(Src) 98.5 F (36.9 C) (Oral)  Ht 5' 10"  (1.778 m)  Wt 181 lb 8 oz (82.328 kg)  BMI 26.04 kg/m2  GEN: WDWN, NAD, Non-toxic, A & O x 3 HEENT: Atraumatic, Normocephalic. Neck supple. No masses, No LAD. Ears and Nose: No external deformity. CV: RRR, No M/G/R. No JVD. No thrill. No extra heart sounds. PULM: CTA B, no wheezes, crackles, rhonchi. No retractions. No resp. distress. No accessory  muscle use. ABD: S, NT, ND, +BS. No rebound. No HSM. EXTR: No c/c/e NEURO Normal gait.  PSYCH: Normally interactive. Conversant. Not depressed or anxious appearing.  Calm demeanor.     Laboratory and Imaging Data:  Assessment and Plan:   Abdominal pain, left lower quadrant - Plan: Basic metabolic panel, CBC with Differential/Platelet, Hepatic function panel, Lipase  Crohn's disease, unspecified complication  Left lower quadrant pain of unclear etiology. Certainly could be colitis, possibly diverticulitis. Patient also has Crohn's disease. Do not suspect acute surgical abdomen. We will check basic laboratories to help assist with disposition and plan of care.  Follow-up: No Follow-up on file.  New Prescriptions   No medications on file   Orders Placed This Encounter  Procedures  . Basic metabolic panel  . CBC with Differential/Platelet  . Hepatic function panel  . Lipase    Signed,  Allora Bains T. Dianely Krehbiel, MD   Patient's Medications  New Prescriptions   No medications on file  Previous Medications   AMLODIPINE (NORVASC) 10 MG TABLET    Take 0.5 tablets (5 mg total) by mouth daily.   ASPIRIN 81 MG TABLET    Take 81 mg by mouth daily.   ATORVASTATIN (LIPITOR) 20 MG TABLET    TAKE 1 TABLET BY MOUTH EVERY DAY   FOLIC ACID (FOLVITE) 1 MG TABLET    TAKE 1 TABLET BY MOUTH DAILY   HYDROCHLOROTHIAZIDE (MICROZIDE) 12.5 MG CAPSULE    Take 1 capsule (12.5 mg total) by mouth daily.   LATANOPROST (XALATAN) 0.005 % OPHTHALMIC SOLUTION    Place 1 drop into the right eye at bedtime.    LOSARTAN (COZAAR) 100 MG TABLET    Take 1 tablet (100 mg total) by mouth daily.   MULTIPLE VITAMINS-MINERALS (CENTRUM PO)    Take 1 tablet by mouth daily.     SULFASALAZINE (AZULFIDINE) 500 MG TABLET    TAKE 2 TABLETS BY MOUTH TWICE A DAY  Modified Medications   No medications on file  Discontinued Medications   No medications on file

## 2014-07-01 NOTE — Progress Notes (Signed)
Pre visit review using our clinic review tool, if applicable. No additional management support is needed unless otherwise documented below in the visit note. 

## 2014-07-02 LAB — CBC WITH DIFFERENTIAL/PLATELET
BASOS ABS: 0 10*3/uL (ref 0.0–0.1)
Basophils Relative: 0.7 % (ref 0.0–3.0)
EOS ABS: 0.3 10*3/uL (ref 0.0–0.7)
EOS PCT: 4.8 % (ref 0.0–5.0)
HEMATOCRIT: 39.6 % (ref 39.0–52.0)
Hemoglobin: 13.2 g/dL (ref 13.0–17.0)
Lymphocytes Relative: 26.8 % (ref 12.0–46.0)
Lymphs Abs: 1.6 10*3/uL (ref 0.7–4.0)
MCHC: 33.4 g/dL (ref 30.0–36.0)
MCV: 91.1 fl (ref 78.0–100.0)
MONOS PCT: 11.3 % (ref 3.0–12.0)
Monocytes Absolute: 0.7 10*3/uL (ref 0.1–1.0)
Neutro Abs: 3.3 10*3/uL (ref 1.4–7.7)
Neutrophils Relative %: 56.4 % (ref 43.0–77.0)
Platelets: 263 10*3/uL (ref 150.0–400.0)
RBC: 4.35 Mil/uL (ref 4.22–5.81)
RDW: 13.2 % (ref 11.5–15.5)
WBC: 5.9 10*3/uL (ref 4.0–10.5)

## 2014-07-02 LAB — BASIC METABOLIC PANEL
BUN: 18 mg/dL (ref 6–23)
CALCIUM: 9.3 mg/dL (ref 8.4–10.5)
CO2: 28 mEq/L (ref 19–32)
CREATININE: 0.87 mg/dL (ref 0.40–1.50)
Chloride: 105 mEq/L (ref 96–112)
GFR: 91.28 mL/min (ref >60.00)
Glucose, Bld: 86 mg/dL (ref 70–99)
POTASSIUM: 3.9 meq/L (ref 3.5–5.1)
Sodium: 138 mEq/L (ref 135–145)

## 2014-07-02 LAB — HEPATIC FUNCTION PANEL
ALK PHOS: 52 U/L (ref 39–117)
ALT: 26 U/L (ref 0–53)
AST: 24 U/L (ref 0–37)
Albumin: 4.3 g/dL (ref 3.5–5.2)
BILIRUBIN DIRECT: 0.2 mg/dL (ref 0.0–0.3)
Total Bilirubin: 0.9 mg/dL (ref 0.2–1.2)
Total Protein: 6.8 g/dL (ref 6.0–8.3)

## 2014-07-02 LAB — LIPASE: LIPASE: 27 U/L (ref 11.0–59.0)

## 2014-08-18 ENCOUNTER — Other Ambulatory Visit (INDEPENDENT_AMBULATORY_CARE_PROVIDER_SITE_OTHER): Payer: Medicare Other

## 2014-08-18 DIAGNOSIS — E785 Hyperlipidemia, unspecified: Secondary | ICD-10-CM | POA: Diagnosis not present

## 2014-08-18 DIAGNOSIS — Z125 Encounter for screening for malignant neoplasm of prostate: Secondary | ICD-10-CM

## 2014-08-18 LAB — LIPID PANEL
CHOL/HDL RATIO: 3
CHOLESTEROL: 148 mg/dL (ref 0–200)
HDL: 47.9 mg/dL (ref >39.00)
LDL Cholesterol: 89 mg/dL (ref 0–99)
NonHDL: 100.1
Triglycerides: 56 mg/dL (ref 0.0–149.0)
VLDL: 11.2 mg/dL (ref 0.0–40.0)

## 2014-08-18 LAB — PSA, MEDICARE: PSA: 1.23 ng/ml (ref 0.10–4.00)

## 2014-08-20 ENCOUNTER — Ambulatory Visit (INDEPENDENT_AMBULATORY_CARE_PROVIDER_SITE_OTHER): Payer: Medicare Other | Admitting: Family Medicine

## 2014-08-20 ENCOUNTER — Encounter: Payer: Self-pay | Admitting: Internal Medicine

## 2014-08-20 ENCOUNTER — Encounter: Payer: Self-pay | Admitting: Family Medicine

## 2014-08-20 VITALS — BP 124/70 | HR 76 | Temp 98.5°F | Ht 70.5 in | Wt 179.5 lb

## 2014-08-20 DIAGNOSIS — Z0000 Encounter for general adult medical examination without abnormal findings: Secondary | ICD-10-CM | POA: Diagnosis not present

## 2014-08-20 NOTE — Progress Notes (Signed)
Pre visit review using our clinic review tool, if applicable. No additional management support is needed unless otherwise documented below in the visit note. 

## 2014-08-20 NOTE — Patient Instructions (Signed)
[facility]: Jeffery [name] Or Jeffery [name]

## 2014-08-20 NOTE — Progress Notes (Signed)
Dr. Frederico Hamman T. Torrie Namba, MD, [facility] [place], [address] Phone: [phone] Fax: [phone]  08/20/2014  Patient: Jeffery [name], MRN: [id number], DOB: [birth year], 74 y.o.  Primary Physician:  Owens Loffler, MD  Chief Complaint: Annual Exam  Subjective:   Jeffery [name] is a 74 y.o. pleasant patient who presents for a medicare wellness examination:  Preventative Health Maintenance Visit:  Health Maintenance Summary Reviewed and updated, unless pt declines services.  Tobacco History Reviewed. Alcohol: No concerns, no excessive use Exercise Habits: 3-5 days a week, rec at least 30 mins 5 times a week STD concerns: no risk or activity to increase risk Drug Use: None Encouraged self-testicular check  Health Maintenance  Topic Date Due  . INFLUENZA VACCINE  08/25/2014  . COLONOSCOPY  04/14/2016  . TETANUS/TDAP  08/03/2022  . ZOSTAVAX  Completed  . PNA vac Low Risk Adult  Completed    Immunization History  Administered Date(s) Administered  . Influenza Split 11/10/2010, 10/03/2011  . Influenza Whole 12/29/2008  . Influenza,inj,Quad PF,36+ Mos 11/20/2012, 12/25/2013  . Pneumococcal Conjugate-13 08/07/2013  . Pneumococcal Polysaccharide-23 08/07/2008  . Tdap 08/02/2012  . Zoster 08/07/2008    Patient Active Problem List   Diagnosis Date Noted  . OTHER SPECIFIED FORMS OF HEARING LOSS 08/10/2009  . HYPERCHOLESTEROLEMIA  IIA 11/25/2008  . BENIGN PROSTATIC HYPERTROPHY, HX OF, S/P TURP 08/07/2008  . INTERNAL HEMORRHOIDS 05/14/2008  . ESOPHAGEAL MOTILITY DISORDER 05/14/2008  . ANAL FISTULA 05/14/2008  . COLONIC POLYPS, HYPERPLASTIC, HX OF 05/14/2008  . ESOPHAGITIS, HX OF 05/14/2008  . HYPERTENSION, BENIGN 05/06/2008  . CAD 05/06/2008  . Crohn's disease 05/06/2008   Past Medical History  Diagnosis Date  . Anal fistula   . History of esophagitis   . Internal hemorrhoids   . Hyperplastic colon polyp     . Esophageal motility disorder   . CAD (coronary artery disease)   . Crohn disease   . Hyperlipidemia   . Hypertension   . Hearing loss   . History of BPH   . GERD (gastroesophageal reflux disease)    Past Surgical History  Procedure Laterality Date  . Eye surgery      right eye  . Hernia repair      x 2  . Lipoma excision      chest  . Prostate surgery      x 2, TURP   History   Social History  . Marital Status: Married    Spouse Name: N/A  . Number of Children: 2  . Years of Education: N/A   Occupational History  . Retired-safety consulting    Social History Main Topics  . Smoking status: Former Smoker -- 1.00 packs/day for 20 years    Types: Cigarettes    Quit date: 01/24/1978  . Smokeless tobacco: Never Used  . Alcohol Use: 0.5 oz/week    1 drink(s) per week     Comment: occasional wine or beer weekly  . Drug Use: No  . Sexual Activity: Not on file   Other Topics Concern  . Not on file   Social History Narrative   Daily caffeine   Family History  Problem Relation Age of Onset  . Colon polyps Mother   . Colon cancer Neg Hx   . Esophageal cancer Neg Hx   . Rectal cancer Neg Hx   . Stomach cancer Neg Hx   . Heart attack Father 45   Allergies  Allergen Reactions  . Ace Inhibitors Other (See Comments)    REACTION: cough    Medication list has been reviewed and updated.   General: Denies fever, chills, sweats. No significant weight loss. Eyes: Denies blurring,significant itching ENT: Denies earache, sore throat, and hoarseness. Cardiovascular: Denies chest pains, palpitations, dyspnea on exertion Respiratory: Denies cough, dyspnea at rest,wheeezing Breast: no concerns about lumps GI: Denies nausea, vomiting, diarrhea, constipation, change in bowel habits, abdominal pain, melena, hematochezia. occ abd pain GU: Denies penile discharge, ED, urinary flow / outflow problems. No STD concerns. Musculoskeletal: Denies back pain, joint pain Derm:  Denies rash, itching Neuro: Denies  paresthesias, frequent falls, frequent headaches Psych: Denies depression, anxiety Endocrine: Denies cold intolerance, heat intolerance, polydipsia Heme: Denies enlarged lymph nodes Allergy: No hayfever  Objective:   BP 124/70 mmHg  Pulse 76  Temp(Src) 98.5 F (36.9 C) (Oral)  Ht 5' 10.5" (1.791 m)  Wt 179 lb 8 oz (81.421 kg)  BMI 25.38 kg/m2  The patient completed a fall screen and PHQ-2 and PHQ-9 if necessary, which is documented in the EHR. The CMA/LPN/RN who assisted the patient verbally completed with them and documented results in [place].  Hearing Screening Comments: Wears Bilateral Hearing Aides Vision Screening Comments: Wears Glasses-Eye Exam with Dr Volanda Napoleon at [facility] 01/2014  GEN: well developed, well nourished, no acute distress Eyes: conjunctiva and lids normal, PERRLA, EOMI ENT: TM clear, nares clear, oral exam WNL Neck: supple, no lymphadenopathy, no thyromegaly, no JVD Pulm: clear to auscultation and percussion, respiratory effort normal CV: regular rate and rhythm, S1-S2, no murmur, rub or gallop, no bruits, peripheral pulses normal and symmetric, no cyanosis, clubbing, edema or varicosities GI: soft, non-tender; no hepatosplenomegaly, masses; active bowel sounds all quadrants GU: no hernia, testicular mass, penile discharge Lymph: no cervical, axillary or inguinal adenopathy MSK: gait normal, muscle tone and strength WNL, no joint swelling, effusions, discoloration, crepitus  SKIN: clear, good turgor, color WNL, no rashes, lesions, or ulcerations Neuro: normal mental status, normal strength, sensation, and motion Psych: alert; oriented to person, place and time, normally interactive and not anxious or depressed in appearance.  All labs reviewed with patient.  Lipids:    Component Value Date/Time   CHOL 148 08/18/2014 0814   TRIG 56.0 08/18/2014 0814   HDL 47.90 08/18/2014 0814   LDLDIRECT 160.7 06/16/2008  0913   VLDL 11.2 08/18/2014 0814   CHOLHDL 3 08/18/2014 0814   CBC: CBC Latest Ref Rng 07/01/2014 08/02/2013 07/24/2012  WBC 4.0 - 10.5 K/uL 5.9 5.4 6.6  Hemoglobin 13.0 - 17.0 g/dL 13.2 13.1 13.6  Hematocrit 39.0 - 52.0 % 39.6 39.7 40.9  Platelets 150.0 - 400.0 K/uL 263.0 218.0 536.6    Basic Metabolic Panel:    Component Value Date/Time   NA 138 07/01/2014 1531   K 3.9 07/01/2014 1531   CL 105 07/01/2014 1531   CO2 28 07/01/2014 1531   BUN 18 07/01/2014 1531   CREATININE 0.87 07/01/2014 1531   GLUCOSE 86 07/01/2014 1531   CALCIUM 9.3 07/01/2014 1531   Hepatic Function Latest Ref Rng 07/01/2014 08/02/2013 07/24/2012  Total Protein 6.0 - 8.3 g/dL 6.8 6.1 6.5  Albumin 3.5 - 5.2 g/dL 4.3 4.0 4.1  AST 0 - 37 U/L 24 27 29   ALT 0 - 53 U/L 26 24 23   Alk Phosphatase 39 - 117 U/L 52 55 50  Total Bilirubin 0.2 - 1.2 mg/dL 0.9 1.1 1.2  Bilirubin, Direct 0.0 - 0.3 mg/dL 0.2  0.2 0.2    Lab Results  Component Value Date   TSH 3.33 08/02/2013   Lab Results  Component Value Date   PSA 1.23 08/18/2014   PSA 1.03 08/02/2013   PSA 1.01 07/24/2012    Assessment and Plan:   Healthcare maintenance  Health Maintenance Exam: The patient's preventative maintenance and recommended screening tests for an annual wellness exam were reviewed in full today. Brought up to date unless services declined.  Counselled on the importance of diet, exercise, and its role in overall health and mortality. The patient's FH and SH was reviewed, including their home life, tobacco status, and drug and alcohol status.  I have personally reviewed the Medicare Annual Wellness questionnaire and have noted 1. The patient's medical and social history 2. Their use of alcohol, tobacco or illicit drugs 3. Their current medications and supplements 4. The patient's functional ability including ADL's, fall risks, home safety risks and hearing or visual             impairment. 5. Diet and physical activities 6. Evidence  for depression or mood disorders 7. Reviewed Updated provider list, see scanned forms and [facility] Snapshot.   The patients weight, height, BMI and visual acuity have been recorded in the chart I have made referrals, counseling and provided education to the patient based review of the above and I have provided the pt with a written personalized care plan for preventive services.  I have provided the patient with a copy of your personalized plan for preventive services. Instructed to take the time to review along with their updated medication list.  Doing well  Follow-up: No Follow-up on file. Or follow-up in 1 year for complete physical examination  Signed,  Frederico Hamman T. Hlee Fringer, MD   Patient's Medications  New Prescriptions   No medications on file  Previous Medications   AMLODIPINE (NORVASC) 10 MG TABLET    Take 0.5 tablets (5 mg total) by mouth daily.   ASPIRIN 81 MG TABLET    Take 81 mg by mouth daily.   ATORVASTATIN (LIPITOR) 20 MG TABLET    TAKE 1 TABLET BY MOUTH EVERY DAY   FOLIC ACID (FOLVITE) 1 MG TABLET    TAKE 1 TABLET BY MOUTH DAILY   HYDROCHLOROTHIAZIDE (MICROZIDE) 12.5 MG CAPSULE    Take 1 capsule (12.5 mg total) by mouth daily.   LATANOPROST (XALATAN) 0.005 % OPHTHALMIC SOLUTION    Place 1 drop into the right eye at bedtime.    LOSARTAN (COZAAR) 100 MG TABLET    Take 1 tablet (100 mg total) by mouth daily.   MULTIPLE VITAMINS-MINERALS (CENTRUM PO)    Take 1 tablet by mouth daily.     SULFASALAZINE (AZULFIDINE) 500 MG TABLET    TAKE 2 TABLETS BY MOUTH TWICE A DAY  Modified Medications   No medications on file  Discontinued Medications   No medications on file

## 2014-09-04 ENCOUNTER — Other Ambulatory Visit: Payer: Self-pay | Admitting: Family Medicine

## 2014-09-04 ENCOUNTER — Other Ambulatory Visit: Payer: Self-pay

## 2014-09-04 MED ORDER — SULFASALAZINE 500 MG PO TABS: ORAL_TABLET | ORAL | Status: DC

## 2014-09-19 ENCOUNTER — Ambulatory Visit: Payer: Medicare Other | Admitting: Internal Medicine

## 2014-10-02 ENCOUNTER — Other Ambulatory Visit: Payer: Self-pay | Admitting: Family Medicine

## 2014-10-07 ENCOUNTER — Ambulatory Visit: Payer: Medicare Other | Admitting: Internal Medicine

## 2014-10-28 ENCOUNTER — Other Ambulatory Visit: Payer: Self-pay | Admitting: Family Medicine

## 2014-10-29 ENCOUNTER — Encounter: Payer: Self-pay | Admitting: Internal Medicine

## 2014-10-29 ENCOUNTER — Ambulatory Visit (INDEPENDENT_AMBULATORY_CARE_PROVIDER_SITE_OTHER): Payer: Medicare Other | Admitting: Internal Medicine

## 2014-10-29 VITALS — BP 128/74 | HR 64 | Ht 72.0 in | Wt 188.0 lb

## 2014-10-29 DIAGNOSIS — K5010 Crohn's disease of large intestine without complications: Secondary | ICD-10-CM | POA: Diagnosis not present

## 2014-10-29 NOTE — Progress Notes (Signed)
Subjective:    Patient ID: Jeffery [name], male    DOB: March 31, 1940, 74 y.o.   MRN: [id number]  HPI Jeffery [name] is a 74 year old male with past medical history of Crohn's colitis, primarily left-sided diagnosed in 1986, GERD, hypertension, hyperlipidemia who is seen in follow-up. He was managed for many years by Dr. Olevia Perches and this is his first visit with me. He was last seen in the office on 09/17/2013. He has been maintained on sulfasalazine 1 g twice daily and folic acid 1 mg daily. He reports that currently he is doing well. He does have a vague and subtle left mid to lower abdominal discomfort when he thinks about it. Back in the summer for about 2-3 weeks it became more intense and he was seen by his primary provider. This resolved. He estimates this is been there for 2-3 years at the current very low level. He reports bowel movements a been formed and nonbloody. No diarrhea or constipation. Good appetite. No nausea vomiting. No dysphagia. No hepatobiliary complaint. No family history of IBD or colon cancer. He does not use tobacco  His last colonoscopy was 04/15/2011 and was normal other than internal hemorrhoids. Biopsies from the right left colon were histologically normal. There were no features of idiopathic IBD, dysplasia or malignancy.   According to Dr. Nichola Sizer last note he had colonoscopies in 1989, 1994, 1999 all showing granulomas in the left colon. Colonoscopy also performed in 2007.  Review of Systems As per history of present illness, otherwise negative  Current Medications, Allergies, Past Medical History, Past Surgical History, Family History and Social History were reviewed in [employer] record.     Objective:   Physical Exam BP 128/74 mmHg  Pulse 64  Ht 6' (1.829 m)  Wt 188 lb (85.276 kg)  BMI 25.49 kg/m2 Constitutional: Well-developed and well-nourished. No distress. HEENT: Normocephalic and atraumatic. Oropharynx is clear and moist. No  oropharyngeal exudate. Conjunctivae are normal.  No scleral icterus. Neck: Neck supple. Trachea midline. Cardiovascular: Normal rate, regular rhythm and intact distal pulses. No M/R/G Pulmonary/chest: Effort normal and breath sounds normal. No wheezing, rales or rhonchi. Abdominal: Soft, nontender, nondistended. Bowel sounds active throughout. Small lipoma in the upper right abdomen Extremities: no clubbing, cyanosis, or edema Lymphadenopathy: No cervical adenopathy noted. Neurological: Alert and oriented to person place and time. Skin: Skin is warm and dry. No rashes noted. Psychiatric: Normal mood and affect. Behavior is normal.  CBC    Component Value Date/Time   WBC 5.9 07/01/2014 1531   RBC 4.35 07/01/2014 1531   HGB 13.2 07/01/2014 1531   HCT 39.6 07/01/2014 1531   PLT 263.0 07/01/2014 1531   MCV 91.1 07/01/2014 1531   MCHC 33.4 07/01/2014 1531   RDW 13.2 07/01/2014 1531   LYMPHSABS 1.6 07/01/2014 1531   MONOABS 0.7 07/01/2014 1531   EOSABS 0.3 07/01/2014 1531   BASOSABS 0.0 07/01/2014 1531    CMP     Component Value Date/Time   NA 138 07/01/2014 1531   K 3.9 07/01/2014 1531   CL 105 07/01/2014 1531   CO2 28 07/01/2014 1531   GLUCOSE 86 07/01/2014 1531   BUN 18 07/01/2014 1531   CREATININE 0.87 07/01/2014 1531   CALCIUM 9.3 07/01/2014 1531   PROT 6.8 07/01/2014 1531   ALBUMIN 4.3 07/01/2014 1531   AST 24 07/01/2014 1531   ALT 26 07/01/2014 1531   ALKPHOS 52 07/01/2014 1531   BILITOT 0.9 07/01/2014 1531   GFRNONAA 82.62  08/18/2009 0851   GFRAA 109 09/17/2007 0845      Assessment & Plan:  74 year old male with past medical history of Crohn's colitis, primarily left-sided diagnosed in 1986, GERD, hypertension, hyperlipidemia who is seen in follow-up.  1. Long-standing Crohn's colitis -- his symptoms from the summer raised the question of active inflammation in the left colon. He is been maintained on sulfasalazine for many years currently 1 g twice a day. He  reports to the years sulfasalazine was switched for the "latest in greatest new medicine" and each time this was done he would flare with colitis after about a month off sulfasalazine. My suspicion for overt flare currently is low. Labs were very normal in the summer. No evidence of anemia. We discussed overall IBD surveillance guidelines which support surveillance colonoscopy every 12-24 months. I have recommended repeating colonoscopy at this time for surveillance given his last exam was 3-1/2 years ago. If biopsies show histologically normal colon without evidence of chronic or active disease, the utility of surveillance colonoscopy can be further debated. For now I will have him continue sulfasalazine 1 g twice a day, folic acid 1 mg daily. He prefers to have surveillance colonoscopy in February at a time when his wife for be in town and can provide transportation. Assuming he does not develop active signs of inflammation, we will plan colonoscopy in February 2017. We discussed the risks, benefits and alternatives to colonoscopy and he is agreeable to proceed  25 min spent with pt today

## 2014-10-29 NOTE — Patient Instructions (Signed)
Please continue sulfasalazine 2 tablets twice daily as well as folic acid.  We will contact you to schedule a colonoscopy around 02/2015.

## 2014-11-27 ENCOUNTER — Ambulatory Visit (INDEPENDENT_AMBULATORY_CARE_PROVIDER_SITE_OTHER): Payer: Medicare Other

## 2014-11-27 DIAGNOSIS — Z23 Encounter for immunization: Secondary | ICD-10-CM

## 2014-12-21 ENCOUNTER — Encounter: Payer: Self-pay | Admitting: Internal Medicine

## 2014-12-21 ENCOUNTER — Telehealth: Payer: Self-pay | Admitting: Internal Medicine

## 2014-12-22 MED ORDER — SULFASALAZINE 500 MG PO TABS: ORAL_TABLET | ORAL | Status: DC

## 2014-12-22 NOTE — Telephone Encounter (Signed)
Rx has been sent  

## 2014-12-29 ENCOUNTER — Telehealth: Payer: Self-pay | Admitting: *Deleted

## 2014-12-29 NOTE — Telephone Encounter (Signed)
I have left a voicemail on patient's home number for him to call back. He needs to be set up for colonoscopy and previsit.

## 2014-12-29 NOTE — Telephone Encounter (Signed)
-----   Message from Larina Bras, [place] sent at 10/29/2014 10:20 AM EDT ----- Per Dr Hilarie Fredrickson, schedule pt direct for colon 02/2015. See 10/29/14 office visit

## 2014-12-29 NOTE — Telephone Encounter (Signed)
Patient has been scheduled for previsit and colonoscopy. He has been advised of dates, times and location of his appointments and verbalizes understanding.

## 2015-01-28 ENCOUNTER — Other Ambulatory Visit: Payer: Self-pay | Admitting: *Deleted

## 2015-01-28 MED ORDER — ATORVASTATIN CALCIUM 20 MG PO TABS: 20.0000 mg | ORAL_TABLET | Freq: Every day | ORAL | Status: DC

## 2015-02-07 ENCOUNTER — Encounter: Payer: Self-pay | Admitting: Internal Medicine

## 2015-02-10 ENCOUNTER — Ambulatory Visit (AMBULATORY_SURGERY_CENTER): Payer: Self-pay

## 2015-02-10 VITALS — Ht 72.0 in | Wt 187.6 lb

## 2015-02-10 DIAGNOSIS — Z8719 Personal history of other diseases of the digestive system: Secondary | ICD-10-CM

## 2015-02-10 MED ORDER — NA SULFATE-K SULFATE-MG SULF 17.5-3.13-1.6 GM/177ML PO SOLN: ORAL | Status: DC

## 2015-02-10 NOTE — Progress Notes (Signed)
Per pt, no allergies to soy or egg products.Pt not taking any weight loss meds or using  O2 at home. 

## 2015-03-02 ENCOUNTER — Other Ambulatory Visit: Payer: Self-pay | Admitting: *Deleted

## 2015-03-02 ENCOUNTER — Encounter: Payer: Self-pay | Admitting: Cardiovascular Disease

## 2015-03-02 ENCOUNTER — Ambulatory Visit (INDEPENDENT_AMBULATORY_CARE_PROVIDER_SITE_OTHER): Payer: Medicare Other | Admitting: Cardiovascular Disease

## 2015-03-02 VITALS — BP 130/80 | HR 62 | Ht 72.0 in | Wt 187.0 lb

## 2015-03-02 DIAGNOSIS — I2510 Atherosclerotic heart disease of native coronary artery without angina pectoris: Secondary | ICD-10-CM | POA: Diagnosis not present

## 2015-03-02 DIAGNOSIS — I10 Essential (primary) hypertension: Secondary | ICD-10-CM | POA: Diagnosis not present

## 2015-03-02 DIAGNOSIS — E785 Hyperlipidemia, unspecified: Secondary | ICD-10-CM | POA: Diagnosis not present

## 2015-03-02 MED ORDER — HYDROCHLOROTHIAZIDE 12.5 MG PO CAPS: 12.5000 mg | ORAL_CAPSULE | Freq: Every day | ORAL | Status: DC

## 2015-03-02 MED ORDER — SULFASALAZINE 500 MG PO TABS: ORAL_TABLET | ORAL | Status: DC

## 2015-03-02 NOTE — Telephone Encounter (Signed)
[facility] mail in pharmacy requests rx for patient. Rx sent.

## 2015-03-02 NOTE — Progress Notes (Signed)
Chief Complaint  Patient presents with  . Leg Pain     History of Present Illness: 75 yo male with history of CAD, HTN, HLD here today for cardiac follow up. He has been followed in the past by Dr. Lia Foyer. He presented with chest pain in August 2009. Cardiac cath with mild non-obstructive disease. He has Crohn's disease in remission. Nuclear stress test February 2016 without ischemia.   He is here today for follow up. He describes occasional chest pains at rest that last for several hours. Also exertional dyspnea. He is very active.   Primary Care Physician: Owens Loffler   Past Medical History  Diagnosis Date  . Anal fistula   . History of esophagitis   . Internal hemorrhoids   . Hyperplastic colon polyp   . Esophageal motility disorder   . CAD (coronary artery disease)   . Crohn disease ([place])   . Hyperlipidemia   . Hypertension   . Hearing loss   . History of BPH   . GERD (gastroesophageal reflux disease)   . Right inguinal hernia   . Chest pain     in past/? heartburn/EKG normal    Past Surgical History  Procedure Laterality Date  . Eye surgery      right eye  . Hernia repair      x 2  . Lipoma excision      chest  . Prostate surgery      x 2, TURP    Current Outpatient Prescriptions  Medication Sig Dispense Refill  . amLODipine (NORVASC) 10 MG tablet TAKE 1/2 TABLET BY MOUTH EVERY DAY. 45 tablet 3  . aspirin 81 MG tablet Take 81 mg by mouth daily.    Marland Kitchen atorvastatin (LIPITOR) 20 MG tablet Take 1 tablet (20 mg total) by mouth daily. 90 tablet 1  . folic acid (FOLVITE) 1 MG tablet TAKE 1 TABLET BY MOUTH DAILY 100 tablet 3  . hydrochlorothiazide (MICROZIDE) 12.5 MG capsule TAKE ONE CAPSULE BY MOUTH DAILY 90 capsule 3  . latanoprost (XALATAN) 0.005 % ophthalmic solution Place 1 drop into the right eye at bedtime.     Marland Kitchen losartan (COZAAR) 100 MG tablet TAKE 1 TABLET BY MOUTH DAILY 90 tablet 3  . Multiple Vitamins-Minerals (CENTRUM PO) Take 1 tablet by mouth  daily.      . Na Sulfate-K Sulfate-Mg Sulf (SUPREP BOWEL PREP) SOLN Suprep as directed / no substitutions 354 mL 0  . sulfaSALAzine (AZULFIDINE) 500 MG tablet TAKE 2 TABLETS BY MOUTH TWICE A DAY 360 tablet 1   No current facility-administered medications for this visit.    Allergies  Allergen Reactions  . Ace Inhibitors Other (See Comments)    REACTION: cough    Social History   Social History  . Marital Status: Married    Spouse Name: N/A  . Number of Children: 2  . Years of Education: N/A   Occupational History  . Retired-safety consulting    Social History Main Topics  . Smoking status: Former Smoker -- 1.00 packs/day for 20 years    Types: Cigarettes    Quit date: 01/24/1978  . Smokeless tobacco: Never Used  . Alcohol Use: 0.6 oz/week    1 Standard drinks or equivalent per week     Comment: occasional wine or beer weekly  . Drug Use: No  . Sexual Activity: Not on file   Other Topics Concern  . Not on file   Social History Narrative   Daily caffeine  Family History  Problem Relation Age of Onset  . Colon polyps Mother   . Colon cancer Neg Hx   . Esophageal cancer Neg Hx   . Rectal cancer Neg Hx   . Stomach cancer Neg Hx   . Heart attack Father 81    Review of Systems:  As stated in the HPI and otherwise negative.   BP 130/80 mmHg  Pulse 62  Ht 6' (1.829 m)  Wt 187 lb (84.823 kg)  BMI 25.36 kg/m2  Physical Examination: General: Well developed, well nourished, NAD HEENT: OP clear, mucus membranes moist SKIN: warm, dry. No rashes. Neuro: No focal deficits Musculoskeletal: Muscle strength 5/5 all ext Psychiatric: Mood and affect normal Neck: No JVD, no carotid bruits, no thyromegaly, no lymphadenopathy. Lungs:Clear bilaterally, no wheezes, rhonci, crackles Cardiovascular: Regular rate and rhythm. No murmurs, gallops or rubs. Abdomen:Soft. Bowel sounds present. Non-tender.  Extremities: No lower extremity edema. Pulses are 2 + in the bilateral  DP/PT  Nuclear stress study: February 2016: Stress Procedure: The patient exercised on the treadmill utilizing the Bruce Protocol for 7:03 minutes. The patient stopped due to sob and denied any chest pain. Technetium 59mSestamibi was injected at peak exercise and myocardial perfusion imaging was performed after a brief delay. Stress ECG: No significant change from baseline ECG  QPS Raw Data Images: Normal; no motion artifact; normal heart/lung ratio. Stress Images: Normal homogeneous uptake in all areas of the myocardium. Rest Images: Normal homogeneous uptake in all areas of the myocardium. Subtraction (SDS): No evidence of ischemia. Transient Ischemic Dilatation (Normal <1.22): 0.81 Lung/Heart Ratio (Normal <0.45): 0.28  Quantitative Gated Spect Images QGS EDV: 105 ml QGS ESV: 48 ml  Impression Exercise Capacity: Good exercise capacity. BP Response: Normal blood pressure response. Clinical Symptoms: No chest pain. ECG Impression: No significant ST segment change suggestive of ischemia. Comparison with Prior Nuclear Study: No images to compare  Overall Impression: Normal stress nuclear study.  LV Ejection Fraction: 55%. LV Wall Motion: NL LV Function; NL Wall Motion  EKG:  EKG is ordered today. The ekg ordered today demonstrates NSR, rate 61 bpm.   Recent Labs: 07/01/2014: ALT 26; BUN 18; Creatinine, Ser 0.87; Hemoglobin 13.2; Platelets 263.0; Potassium 3.9; Sodium 138   Lipid Panel    Component Value Date/Time   CHOL 148 08/18/2014 0814   TRIG 56.0 08/18/2014 0814   HDL 47.90 08/18/2014 0814   CHOLHDL 3 08/18/2014 0814   VLDL 11.2 08/18/2014 0814   LDLCALC 89 08/18/2014 0814   LDLDIRECT 160.7 06/16/2008 0913     Wt Readings from Last 3 Encounters:  03/02/15 187 lb (84.823 kg)  02/10/15 187 lb 9.6 oz (85.095 kg)  10/29/14 188 lb (85.276 kg)     Other studies Reviewed: Additional studies/ records that were reviewed today include: . Review of the  above records demonstrates:    Cardiac cath 8/09: 1. Ventriculography was done in the RAO projection. Overall systolic  function appeared preserved. No definite segmental abnormalities  or contraction were identified.  2. The left main does demonstrate a little bit of tenting at the  ostium. Multiple views were obtained, and there was good efflux of  contrast back into the aorta. High-grade obstruction is not noted,  but the tenting would represent probably about a 30% overall  luminal reduction. The left main itself appears to be smooth.  3. The left anterior descending artery courses to the apex. The first  tiny diagonal branch in the LAO view does demonstrate some  narrowing, probably of 90%, but this is a 1-mm vessel or less. The  second diagonal branch is a large-caliber vessel free of critical  disease in the mid and distal LAD. All appear free of significant  high-grade disease.  4. The ramus intermedius is a moderate-size vessel with minimal  proximal luminal irregularity.  5. The circumflex coronary artery provides 2 marginal branches and has  no significant obstruction.  6. The right coronary artery also was minimally tapered at the ostium.  The right coronary, however, is smooth throughout providing a  posterior descending and posterolateral branch with minimal or mild  plaquing at the origin of the PDA, which is not hemodynamically  significant.  Assessment and Plan:   1. CAD: Stable. He is known to have mild CAD. Stress test February 2016 with no ischemia, normal LV systolic function. Will continue ASA, statin, ARB.   2. HTN: BP is well controlled. No changes today. Continue current meds.   3. HLD: Lipids well controlled. Continue statin.   Current medicines are reviewed at length with the patient today.  The patient does not have concerns regarding medicines.  The following changes have been made:  no change  Labs/ tests ordered today include:   Orders Placed  This Encounter  Procedures  . EKG 12-Lead    Disposition:   FU with me in 12  months  Signed, Lauree Chandler, MD 03/02/2015 8:39 AM    [facility] [address], [place], [place]  [address] Phone: [phone]; Fax: [phone]

## 2015-03-02 NOTE — Patient Instructions (Signed)

## 2015-03-04 ENCOUNTER — Ambulatory Visit (AMBULATORY_SURGERY_CENTER): Payer: Medicare Other | Admitting: Internal Medicine

## 2015-03-04 ENCOUNTER — Encounter: Payer: Self-pay | Admitting: Internal Medicine

## 2015-03-04 VITALS — BP 111/42 | HR 56 | Temp 97.9°F | Resp 16 | Ht 72.0 in | Wt 187.0 lb

## 2015-03-04 DIAGNOSIS — K639 Disease of intestine, unspecified: Secondary | ICD-10-CM | POA: Diagnosis not present

## 2015-03-04 DIAGNOSIS — D123 Benign neoplasm of transverse colon: Secondary | ICD-10-CM

## 2015-03-04 DIAGNOSIS — D122 Benign neoplasm of ascending colon: Secondary | ICD-10-CM

## 2015-03-04 DIAGNOSIS — D125 Benign neoplasm of sigmoid colon: Secondary | ICD-10-CM

## 2015-03-04 DIAGNOSIS — D124 Benign neoplasm of descending colon: Secondary | ICD-10-CM

## 2015-03-04 DIAGNOSIS — D128 Benign neoplasm of rectum: Secondary | ICD-10-CM

## 2015-03-04 DIAGNOSIS — D129 Benign neoplasm of anus and anal canal: Secondary | ICD-10-CM

## 2015-03-04 DIAGNOSIS — Z8719 Personal history of other diseases of the digestive system: Secondary | ICD-10-CM | POA: Diagnosis not present

## 2015-03-04 DIAGNOSIS — K635 Polyp of colon: Secondary | ICD-10-CM

## 2015-03-04 MED ORDER — SODIUM CHLORIDE 0.9 % IV SOLN: 500.0000 mL | INTRAVENOUS | Status: DC

## 2015-03-04 NOTE — Patient Instructions (Signed)
YOU HAD AN ENDOSCOPIC PROCEDURE TODAY AT [facility]:   Refer to the procedure report that was given to you for any specific questions about what was found during the examination.  If the procedure report does not answer your questions, please call your gastroenterologist to clarify.  If you requested that your care partner not be given the details of your procedure findings, then the procedure report has been included in a sealed envelope for you to review at your convenience later.  YOU SHOULD EXPECT: Some feelings of bloating in the abdomen. Passage of more gas than usual.  Walking can help get rid of the air that was put into your GI tract during the procedure and reduce the bloating. If you had a lower endoscopy (such as a colonoscopy or flexible sigmoidoscopy) you may notice spotting of blood in your stool or on the toilet paper. If you underwent a bowel prep for your procedure, you may not have a normal bowel movement for a few days.  Please Note:  You might notice some irritation and congestion in your nose or some drainage.  This is from the oxygen used during your procedure.  There is no need for concern and it should clear up in a day or so.  SYMPTOMS TO REPORT IMMEDIATELY:   Following lower endoscopy (colonoscopy or flexible sigmoidoscopy):  Excessive amounts of blood in the stool  Significant tenderness or worsening of abdominal pains  Swelling of the abdomen that is new, acute  Fever of 100F or higher   For urgent or emergent issues, a gastroenterologist can be reached at any hour by calling [phone].   DIET: Your first meal following the procedure should be a small meal and then it is ok to progress to your normal diet. Heavy or fried foods are harder to digest and may make you feel nauseous or bloated.  Likewise, meals heavy in dairy and vegetables can increase bloating.  Drink plenty of fluids but you should avoid alcoholic beverages for 24  hours.  ACTIVITY:  You should plan to take it easy for the rest of today and you should NOT DRIVE or use heavy machinery until tomorrow (because of the sedation medicines used during the test).    FOLLOW UP: Our staff will call the number listed on your records the next business day following your procedure to check on you and address any questions or concerns that you may have regarding the information given to you following your procedure. If we do not reach you, we will leave a message.  However, if you are feeling well and you are not experiencing any problems, there is no need to return our call.  We will assume that you have returned to your regular daily activities without incident.  If any biopsies were taken you will be contacted by phone or by letter within the next 1-3 weeks.  Please call us at [phone] if you have not heard about the biopsies in 3 weeks.    SIGNATURES/CONFIDENTIALITY: You and/or your care partner have signed paperwork which will be entered into your electronic medical record.  These signatures attest to the fact that that the information above on your After Visit Summary has been reviewed and is understood.  Full responsibility of the confidentiality of this discharge information lies with you and/or your care-partner.     Handout was given to your care partner on polyps. You may resume your current medications today. Await biopsy results. Please call  if any questions or concerns.

## 2015-03-04 NOTE — Op Note (Signed)
[facility]  [address] [place], [address]   COLONOSCOPY PROCEDURE REPORT  PATIENT: Jeffery, [name]  MR#: [id number] BIRTHDATE: 1940/09/19 , 74  yrs. old GENDER: male ENDOSCOPIST: Jerene Bears, MD REFERRED MB:PJPE Andris Baumann, M.D. PROCEDURE DATE:  03/04/2015 PROCEDURE:   Colonoscopy, surveillance , Colonoscopy with biopsy, and Colonoscopy with snare polypectomy First Screening Colonoscopy - Avg.  risk and is 50 yrs.  old or older - No.  Prior Negative Screening - Now for repeat screening. N/A  History of Adenoma - Now for follow-up colonoscopy & has been > or = to 3 yrs.  N/A  Polyps removed today? Yes ASA CLASS:   Class III INDICATIONS:Surveillance due to prior colonic neoplasia and Inflammatory Bowel Disease (at least 8 years pancolitis or 15 years left sided colitis). MEDICATIONS: Monitored anesthesia care and Propofol 350 mg IV  DESCRIPTION OF PROCEDURE:   After the risks benefits and alternatives of the procedure were thoroughly explained, informed consent was obtained.  The digital rectal exam revealed no rectal mass.   The LB [id number]  endoscope was introduced through the anus and advanced to the cecum, which was identified by both the appendix and ileocecal valve. No adverse events experienced. The quality of the prep was good.  (Suprep was used)  The instrument was then slowly withdrawn as the colon was fully examined. Estimated blood loss is zero unless otherwise noted in this procedure report.   COLON FINDINGS: Two sessile polyps measuring 5 mm in size were found in the ascending colon and descending colon.  Polypectomies were performed with a cold snare.  The resection was complete, the polyp tissue was completely retrieved and sent to histology.   The colonic mucosa appeared normal throughout the entire examined colon.  Multiple IBD surveillance biopsies in 4 quadrant fashion every 8-10 cm throughout the colon were performed using  cold forceps.  Retroflexed views revealed internal small hemorrhoids. The time to cecum = 3.3 Withdrawal time = 16.5   The scope was withdrawn and the procedure completed. COMPLICATIONS: There were no immediate complications.  ENDOSCOPIC IMPRESSION: 1.   Two sessile polyps were found in the ascending colon and descending colon; polypectomies were performed with a cold snare 2.   The colonic mucosa appeared normal throughout the entire examined colon with no evidence for active Crohn's disease; multiple biopsies were performed using cold forceps  RECOMMENDATIONS: 1.  Await biopsy results 2.  Continue current therapy for IBD  eSigned:  Jerene Bears, MD 03/04/2015 9:32 AM  cc:  the patient, Dr. Lorelei Pont

## 2015-03-04 NOTE — Progress Notes (Signed)
Called to room to assist during endoscopic procedure.  Patient ID and intended procedure confirmed with present staff. Received instructions for my participation in the procedure from the performing physician.  

## 2015-03-04 NOTE — Progress Notes (Signed)
No problems noted in the recovery room. maw 

## 2015-03-04 NOTE — Progress Notes (Signed)
Stable to RR 

## 2015-03-05 ENCOUNTER — Telehealth: Payer: Self-pay

## 2015-03-05 NOTE — Telephone Encounter (Signed)
  Follow up Call-  Call back number 03/04/2015  Post procedure Call Back phone  # [phone]  Permission to leave phone message Yes     Patient questions:  Do you have a fever, pain , or abdominal swelling? No. Pain Score  0 *  Have you tolerated food without any problems? Yes.    Have you been able to return to your normal activities? Yes.    Do you have any questions about your discharge instructions: Diet   No. Medications  No. Follow up visit  No.  Do you have questions or concerns about your Care? No.  Actions: * If pain score is 4 or above: No action needed, pain <4.

## 2015-03-09 ENCOUNTER — Encounter: Payer: Self-pay | Admitting: Internal Medicine

## 2015-03-16 ENCOUNTER — Other Ambulatory Visit: Payer: Self-pay | Admitting: *Deleted

## 2015-03-16 MED ORDER — FOLIC ACID 1 MG PO TABS: 1.0000 mg | ORAL_TABLET | Freq: Every day | ORAL | Status: DC

## 2015-03-16 MED ORDER — LOSARTAN POTASSIUM 100 MG PO TABS: 100.0000 mg | ORAL_TABLET | Freq: Every day | ORAL | Status: DC

## 2015-03-17 ENCOUNTER — Other Ambulatory Visit: Payer: Self-pay | Admitting: *Deleted

## 2015-03-17 MED ORDER — AMLODIPINE BESYLATE 10 MG PO TABS: ORAL_TABLET | ORAL | Status: DC

## 2015-03-23 ENCOUNTER — Encounter: Payer: Self-pay | Admitting: Family Medicine

## 2015-04-30 ENCOUNTER — Telehealth: Payer: Self-pay | Admitting: Family Medicine

## 2015-04-30 NOTE — Telephone Encounter (Signed)
Left message for patient to return call.

## 2015-04-30 NOTE — Telephone Encounter (Signed)
[facility] Patient Name: EAN GETTEL DOB: Mar 03, 1940 Initial Comment Caller states the last couple weeks having tingling in his left hand and pinky and ring fingers Nurse Assessment Nurse: Martyn Ehrich, RN, Felicia Date/Time (Adrian Time): 04/30/2015 3:05:14 PM Confirm and document reason for call. If symptomatic, describe symptoms. You must click the next button to save text entered. ---PT has had tingling/some numbness in L hand and pinky and ring finger - woke up one morning with that 2 weeks ago. Not improving Has the patient traveled out of the country within the last 30 days? ---No Does the patient have any new or worsening symptoms? ---Yes Will a triage be completed? ---Yes Related visit to physician within the last 2 weeks? ---No Does the PT have any chronic conditions? (i.e. diabetes, asthma, etc.) ---YesList chronic conditions. ---crohn's d. in remission Is this a behavioral health or substance abuse call? ---No Guidelines Guideline Title Affirmed Question Affirmed Notes Neurologic Deficit [1] Numbness (i.e., loss of sensation) of the face, arm / hand, or leg / foot on one side of the body AND [2] sudden onset AND [3] present now Final Disposition User 911 Outcome Documentation Gaddy, RN, Felicia Reason: caller declines to call 911 Comments [facility] REFUSES TO CALL 911 AND WANTS TO MAKE AN APPOINTMENT. Melissa reached in the office and gave her Pt 911 dispo and symptoms and that Pt refused to call 911 and pt wants appointment - she will give message to his MD Disagree/Comply: Disagree Disagree/Comply Reason: Disagree with instructionsCall Id: [id number]

## 2015-04-30 NOTE — Telephone Encounter (Signed)
Call pt to get more info. Have him make appt to be seen in next 24 hours unless Dr. Lorelei Pont has other suggestions.

## 2015-04-30 NOTE — Telephone Encounter (Signed)
Gina left v/m for pt to cb.

## 2015-04-30 NOTE — Telephone Encounter (Signed)
Pt called TH with  numbness with his ring and pinkly finger on left hand. It came on 2 weeks ago all of sudden. TH wanted him to call 911. Pt refused.

## 2015-05-01 ENCOUNTER — Ambulatory Visit (INDEPENDENT_AMBULATORY_CARE_PROVIDER_SITE_OTHER): Payer: Medicare Other | Admitting: Family Medicine

## 2015-05-01 ENCOUNTER — Encounter: Payer: Self-pay | Admitting: Family Medicine

## 2015-05-01 VITALS — BP 152/88 | HR 75 | Temp 98.3°F | Wt 185.0 lb

## 2015-05-01 DIAGNOSIS — G5620 Lesion of ulnar nerve, unspecified upper limb: Secondary | ICD-10-CM | POA: Insufficient documentation

## 2015-05-01 DIAGNOSIS — I10 Essential (primary) hypertension: Secondary | ICD-10-CM | POA: Diagnosis not present

## 2015-05-01 DIAGNOSIS — G5622 Lesion of ulnar nerve, left upper limb: Secondary | ICD-10-CM | POA: Diagnosis not present

## 2015-05-01 NOTE — Patient Instructions (Addendum)
Nice to meet you. Your symptoms are likely related to ulnar nerve entrapment at your elbow. You should try to minimize the amount of time you have your left elbow flexed. At night he should wrap elbow and a towel to decrease the likelihood of flexing your elbow. Please take your blood pressure medicine when he get home. If you develop numbness, weakness, vision changes, facial droop, slurred speech, worsening tingling, pain, chest pain, shortness of breath, headaches, or any new or changing symptoms please seek medical attention.

## 2015-05-01 NOTE — Progress Notes (Signed)
Patient ID: Jeffery [name], male   DOB: Jun 13, 1940, 75 y.o.   MRN: [id number]  Jeffery Rumps, MD Phone: [phone]  Jeffery [name] is a 75 y.o. male who presents today for same day visit.  Left pinky and ring finger tingling: Patient notes 3 weeks ago he woke up with a tingling sensation in his left pinky and ring finger. He notes he has had this previously and it typically resolves on its own. Has not gotten better or worse. Notes if he touches his ulnar groove he gets tingling in this area. Notes it is on the ulnar aspect of his ring finger. No other tingling elsewhere. No numbness or weakness. No headaches. No vision changes. No speech changes. No facial droop. No chest pain or shortness of breath. No injury to his elbow. His also had this occur when riding bikes in the past. Notes he did not take his blood pressure medicine this morning as he got out of his routine. It is typically in the 120s/80s at home.  PMH: Former smoker   ROS see history of present illness  Objective  Physical Exam Filed Vitals:   05/01/15 1037 05/01/15 1056  BP: 162/96 152/88  Pulse: 75   Temp: 98.3 F (36.8 C)     Physical Exam  Constitutional: He is well-developed, well-nourished, and in no distress.  HENT:  Head: Normocephalic and atraumatic.  Mouth/Throat: Oropharynx is clear and moist. No oropharyngeal exudate.  Eyes: Conjunctivae are normal. Pupils are equal, round, and reactive to light.  Neck: Neck supple.  Cardiovascular: Normal rate, regular rhythm and normal heart sounds.   Pulmonary/Chest: Effort normal and breath sounds normal.  Musculoskeletal:  No elbow swelling, full range of motion of elbows bilaterally, no elbow tenderness bilaterally  Lymphadenopathy:    He has no cervical adenopathy.  Neurological: He is alert.  CN 2-12 intact, 5/5 strength in bilateral biceps, triceps, grip, quads, hamstrings, plantar and dorsiflexion, sensation to light touch intact in bilateral UE and LE,  normal gait, 2+ patellar reflexes Positive Tinel's at the left ulnar groove, negative Tinel's bilateral wrists  Skin: Skin is warm and dry. He is not diaphoretic.     Assessment/Plan: Please see individual problem list.  Ulnar nerve entrapment at elbow Patient with very focal tingling in the distribution of the ulnar nerve in his hand on the left side. Has been going on for 3 weeks. Has been stable over that time. No tingling elsewhere. No other neurological abnormalities. Did have a positive Tinel's at the left elbow at the ulnar groove. Suspect entrapment at the elbow. Unlikely to be a central nervous system lesion given focality and reproducibility on Tinel's. Discussed minimizing left elbow flexion during the day. Advised to wrap his left elbow in a towel at night to minimize flexion of this. He will continue to monitor this. If it does not improve or if it worsens he will let us know as he may need evaluation by specialist. Given return precautions.  HYPERTENSION, BENIGN Mildly elevated on recheck. Asymptomatic today. Neurologically intact. Has not taken his blood pressure medicine today. Reports is at goal at home typically. He'll take his blood pressure medicine once he gets home. He'll monitor for symptoms. Given return precautions.   Jeffery Rumps, MD [place]

## 2015-05-01 NOTE — Telephone Encounter (Signed)
Pt seeing Dr Biagio Quint 05/01/15. See 04/30/15 phone note as well.

## 2015-05-01 NOTE — Progress Notes (Signed)
Pre visit review using our clinic review tool, if applicable. No additional management support is needed unless otherwise documented below in the visit note. 

## 2015-05-01 NOTE — Telephone Encounter (Signed)
Pt spoke with Melissa, no CP, SOB,difficulty walking or weakness in upper extremities; pt scheduled appt with Dr Biagio Quint 05/01/15 at 11 AM. (please see team health note 04/30/15).

## 2015-05-01 NOTE — Telephone Encounter (Signed)
Patient evaluated in the office.

## 2015-05-01 NOTE — Assessment & Plan Note (Signed)
Patient with very focal tingling in the distribution of the ulnar nerve in his hand on the left side. Has been going on for 3 weeks. Has been stable over that time. No tingling elsewhere. No other neurological abnormalities. Did have a positive Tinel's at the left elbow at the ulnar groove. Suspect entrapment at the elbow. Unlikely to be a central nervous system lesion given focality and reproducibility on Tinel's. Discussed minimizing left elbow flexion during the day. Advised to wrap his left elbow in a towel at night to minimize flexion of this. He will continue to monitor this. If it does not improve or if it worsens he will let us know as he may need evaluation by specialist. Given return precautions.

## 2015-05-01 NOTE — Assessment & Plan Note (Signed)
Mildly elevated on recheck. Asymptomatic today. Neurologically intact. Has not taken his blood pressure medicine today. Reports is at goal at home typically. He'll take his blood pressure medicine once he gets home. He'll monitor for symptoms. Given return precautions.

## 2015-05-20 ENCOUNTER — Other Ambulatory Visit: Payer: Self-pay | Admitting: Internal Medicine

## 2015-06-24 ENCOUNTER — Telehealth: Payer: Self-pay

## 2015-06-24 NOTE — Telephone Encounter (Signed)
Patient is on the list for [employer] 2017 and may be a good candidate for an AWV in 2017.

## 2015-06-30 NOTE — Telephone Encounter (Signed)
Contacted pt and scheduled lab, AWV, and CPE appts for July 2017.

## 2015-07-22 ENCOUNTER — Other Ambulatory Visit: Payer: Self-pay | Admitting: Family Medicine

## 2015-07-22 DIAGNOSIS — Z125 Encounter for screening for malignant neoplasm of prostate: Secondary | ICD-10-CM

## 2015-07-22 DIAGNOSIS — E785 Hyperlipidemia, unspecified: Secondary | ICD-10-CM

## 2015-07-22 DIAGNOSIS — Z79899 Other long term (current) drug therapy: Secondary | ICD-10-CM

## 2015-08-10 ENCOUNTER — Ambulatory Visit (INDEPENDENT_AMBULATORY_CARE_PROVIDER_SITE_OTHER): Payer: Medicare Other

## 2015-08-10 ENCOUNTER — Other Ambulatory Visit (INDEPENDENT_AMBULATORY_CARE_PROVIDER_SITE_OTHER): Payer: Medicare Other

## 2015-08-10 VITALS — BP 124/80 | HR 63 | Temp 97.7°F | Ht 70.0 in | Wt 178.2 lb

## 2015-08-10 DIAGNOSIS — Z125 Encounter for screening for malignant neoplasm of prostate: Secondary | ICD-10-CM

## 2015-08-10 DIAGNOSIS — Z79899 Other long term (current) drug therapy: Secondary | ICD-10-CM

## 2015-08-10 DIAGNOSIS — E785 Hyperlipidemia, unspecified: Secondary | ICD-10-CM | POA: Diagnosis not present

## 2015-08-10 DIAGNOSIS — Z0000 Encounter for general adult medical examination without abnormal findings: Secondary | ICD-10-CM | POA: Diagnosis not present

## 2015-08-10 LAB — LIPID PANEL
Cholesterol: 150 mg/dL (ref 0–200)
HDL: 46.5 mg/dL (ref >39.00)
LDL Cholesterol: 84 mg/dL (ref 0–99)
NONHDL: 103.65
Total CHOL/HDL Ratio: 3
Triglycerides: 96 mg/dL (ref 0.0–149.0)
VLDL: 19.2 mg/dL (ref 0.0–40.0)

## 2015-08-10 LAB — BASIC METABOLIC PANEL
BUN: 17 mg/dL (ref 6–23)
CALCIUM: 9.3 mg/dL (ref 8.4–10.5)
CO2: 28 mEq/L (ref 19–32)
Chloride: 103 mEq/L (ref 96–112)
Creatinine, Ser: 0.88 mg/dL (ref 0.40–1.50)
GFR: 89.81 mL/min (ref >60.00)
GLUCOSE: 88 mg/dL (ref 70–99)
POTASSIUM: 3.7 meq/L (ref 3.5–5.1)
Sodium: 137 mEq/L (ref 135–145)

## 2015-08-10 LAB — HEPATIC FUNCTION PANEL
ALBUMIN: 4.3 g/dL (ref 3.5–5.2)
ALK PHOS: 50 U/L (ref 39–117)
ALT: 17 U/L (ref 0–53)
AST: 20 U/L (ref 0–37)
Bilirubin, Direct: 0.2 mg/dL (ref 0.0–0.3)
Total Bilirubin: 1.3 mg/dL — ABNORMAL HIGH (ref 0.2–1.2)
Total Protein: 6.7 g/dL (ref 6.0–8.3)

## 2015-08-10 LAB — PSA, MEDICARE: PSA: 1.09 ng/ml (ref 0.10–4.00)

## 2015-08-10 NOTE — Patient Instructions (Signed)
Jeffery [name] , Thank you for taking time to come for your Medicare Wellness Visit. I appreciate your ongoing commitment to your health goals. Please review the following plan we discussed and let me know if I can assist you in the future.   These are the goals we discussed: Goals    . Increase physical activity     Starting 08/10/15, I will continue to exercise for at least 30 min 3 days per week.        This is a list of the screening recommended for you and due dates:  Health Maintenance  Topic Date Due  . Flu Shot  08/25/2015  . Colon Cancer Screening  03/03/2018  . Tetanus Vaccine  08/03/2022  . Shingles Vaccine  Completed  . Pneumonia vaccines  Completed    Preventive Care for Adults  A healthy lifestyle and preventive care can promote health and wellness. Preventive health guidelines for adults include the following key practices.  . A routine yearly physical is a good way to check with your health care provider about your health and preventive screening. It is a chance to share any concerns and updates on your health and to receive a thorough exam.  . Visit your dentist for a routine exam and preventive care every 6 months. Brush your teeth twice a day and floss once a day. Good oral hygiene prevents tooth decay and gum disease.  . The frequency of eye exams is based on your age, health, family medical history, use  of contact lenses, and other factors. Follow your health care provider's ecommendations for frequency of eye exams.  . Eat a healthy diet. Foods like vegetables, fruits, whole grains, low-fat dairy products, and lean protein foods contain the nutrients you need without too many calories. Decrease your intake of foods high in solid fats, added sugars, and salt. Eat the right amount of calories for you. Get information about a proper diet from your health care provider, if necessary.  . Regular physical exercise is one of the most important things you can do for your  health. Most adults should get at least 150 minutes of moderate-intensity exercise (any activity that increases your heart rate and causes you to sweat) each week. In addition, most adults need muscle-strengthening exercises on 2 or more days a week.  Silver Sneakers may be a benefit available to you. To determine eligibility, you may visit the website: www.silversneakers.com or contact program at (567) 545-1947 Mon-Fri between 8AM-8PM.   . Maintain a healthy weight. The body mass index (BMI) is a screening tool to identify possible weight problems. It provides an estimate of body fat based on height and weight. Your health care provider can find your BMI and can help you achieve or maintain a healthy weight.   For adults 20 years and older: ? A BMI below 18.5 is considered underweight. ? A BMI of 18.5 to 24.9 is normal. ? A BMI of 25 to 29.9 is considered overweight. ? A BMI of 30 and above is considered obese.   . Maintain normal blood lipids and cholesterol levels by exercising and minimizing your intake of saturated fat. Eat a balanced diet with plenty of fruit and vegetables. Blood tests for lipids and cholesterol should begin at age 61 and be repeated every 5 years. If your lipid or cholesterol levels are high, you are over 50, or you are at high risk for heart disease, you may need your cholesterol levels checked more frequently. Ongoing high lipid  and cholesterol levels should be treated with medicines if diet and exercise are not working.  . If you smoke, find out from your health care provider how to quit. If you do not use tobacco, please do not start.  . If you choose to drink alcohol, please do not consume more than 2 drinks per day. One drink is considered to be 12 ounces (355 mL) of beer, 5 ounces (148 mL) of wine, or 1.5 ounces (44 mL) of liquor.  . If you are 68-53 years old, ask your health care provider if you should take aspirin to prevent strokes.  . Use sunscreen. Apply  sunscreen liberally and repeatedly throughout the day. You should seek shade when your shadow is shorter than you. Protect yourself by wearing long sleeves, pants, a wide-brimmed hat, and sunglasses year round, whenever you are outdoors.  . Once a month, do a whole body skin exam, using a mirror to look at the skin on your back. Tell your health care provider of new moles, moles that have irregular borders, moles that are larger than a pencil eraser, or moles that have changed in shape or color.

## 2015-08-10 NOTE — Progress Notes (Signed)
Subjective:   Jeffery [name] is a 75 y.o. male who presents for Medicare Annual/Subsequent preventive examination.  Review of Systems:  N/A Cardiac Risk Factors include: advanced age (>41mn, >>2women);dyslipidemia;hypertension;male gender     Objective:    Vitals: BP 124/80 mmHg  Pulse 63  Temp(Src) 97.7 F (36.5 C) (Oral)  Ht 5' 10"  (1.778 m)  Wt 178 lb 4 oz (80.854 kg)  BMI 25.58 kg/m2  SpO2 97%  Body mass index is 25.58 kg/(m^2).  Tobacco History  Smoking status  . Former Smoker -- 1.00 packs/day for 20 years  . Types: Cigarettes  . Quit date: 01/24/1978  Smokeless tobacco  . Never Used     Counseling given: No   Past Medical History  Diagnosis Date  . Anal fistula   . History of esophagitis   . Internal hemorrhoids   . Hyperplastic colon polyp   . Esophageal motility disorder   . CAD (coronary artery disease)   . [place]   . Hyperlipidemia   . Hypertension   . Hearing loss   . History of BPH   . GERD (gastroesophageal reflux disease)   . Right inguinal hernia   . Chest pain     in past/? heartburn/EKG normal   Past Surgical History  Procedure Laterality Date  . Eye surgery      right eye  . Hernia repair      x 2  . Lipoma excision      chest  . Prostate surgery      x 2, TURP   Family History  Problem Relation Age of Onset  . Colon polyps Mother   . Colon cancer Neg Hx   . Esophageal cancer Neg Hx   . Rectal cancer Neg Hx   . Stomach cancer Neg Hx   . Heart attack Father 568  History  Sexual Activity  . Sexual Activity: No    Outpatient Encounter Prescriptions as of 08/10/2015  Medication Sig  . amLODipine (NORVASC) 10 MG tablet TAKE 1/2 TABLET BY MOUTH EVERY DAY.  .Marland Kitchenaspirin 81 MG tablet Take 81 mg by mouth daily.  .Marland Kitchenatorvastatin (LIPITOR) 20 MG tablet TAKE 1 TABLET DAILY  . folic acid (FOLVITE) 1 MG tablet Take 1 tablet (1 mg total) by mouth daily.  . hydrochlorothiazide (MICROZIDE) 12.5 MG capsule Take 1 capsule  (12.5 mg total) by mouth daily.  .Marland Kitchenlatanoprost (XALATAN) 0.005 % ophthalmic solution Place 1 drop into the right eye at bedtime.   .Marland Kitchenlosartan (COZAAR) 100 MG tablet Take 1 tablet (100 mg total) by mouth daily.  . Multiple Vitamins-Minerals (CENTRUM PO) Take 1 tablet by mouth daily.    .Marland KitchensulfaSALAzine (AZULFIDINE) 500 MG tablet TAKE 2 TABLETS TWICE A DAY   No facility-administered encounter medications on file as of 08/10/2015.    Activities of Daily Living In your present state of health, do you have any difficulty performing the following activities: 08/10/2015  Hearing? Y  Vision? Y  Difficulty concentrating or making decisions? N  Walking or climbing stairs? N  Dressing or bathing? N  Doing errands, shopping? N  Preparing Food and eating ? N  Using the Toilet? N  In the past six months, have you accidently leaked urine? N  Do you have problems with loss of bowel control? N  Managing your Medications? N  Managing your Finances? N  Housekeeping or managing your Housekeeping? N    Patient Care Team: SOwens Loffler MD as  PCP - General Evangeline Gula, MD as Referring Physician (Ophthalmology) Burnell Blanks, MD as Consulting Physician (Cardiology) Jerene Bears, MD as Consulting Physician (Gastroenterology)   Assessment:    Hearing Screening Comments: Wears bilateral hearing aids Vision Screening Comments: Last vision exam on Jun 23, 2015 with Dr. Volanda Napoleon  Exercise Activities and Dietary recommendations Current Exercise Habits: Home exercise routine;Structured exercise class, Type of exercise: strength training/weights;Other - see comments (bike; Silver Sneakers), Time (Minutes): 45 (30-60 min depending upon day), Frequency (Times/Week): 3, Weekly Exercise (Minutes/Week): 135, Intensity: Moderate, Exercise limited by: None identified  Goals    . Increase physical activity     Starting 08/10/15, I will continue to exercise for at least 30 min 3 days per week.        Fall Risk Fall Risk  08/10/2015 08/20/2014 08/07/2013  Falls in the past year? No No No   Depression Screen PHQ 2/9 Scores 08/10/2015 08/20/2014 08/07/2013  PHQ - 2 Score 0 0 0    Cognitive Testing MMSE - Mini Mental State Exam 08/10/2015  Orientation to time 5  Orientation to Place 5  Registration 3  Attention/ Calculation 0  Recall 3  Language- name 2 objects 0  Language- repeat 1  Language- follow 3 step command 3  Language- read & follow direction 0  Write a sentence 0  Copy design 0  Total score 20   PLEASE NOTE: A Mini-Cog screen was completed. Maximum score is 20. A value of 0 denotes this part of [place] MMSE was not completed or the patient failed this part of the Mini-Cog screening.   Mini-Cog Screening Orientation to Time - Max 5 pts Orientation to Place - Max 5 pts Registration - Max 3 pts Recall - Max 3 pts Language Repeat - Max 1 pts Language Follow 3 Step Command - Max 3 pts  Immunization History  Administered Date(s) Administered  . Influenza Split 11/10/2010, 10/03/2011  . Influenza Whole 12/29/2008  . Influenza,inj,Quad PF,36+ Mos 11/20/2012, 12/25/2013, 11/27/2014  . Pneumococcal Conjugate-13 08/07/2013  . Pneumococcal Polysaccharide-23 08/07/2008  . Tdap 08/02/2012  . Zoster 08/07/2008   Screening Tests Health Maintenance  Topic Date Due  . INFLUENZA VACCINE  08/25/2015  . COLONOSCOPY  03/03/2018  . TETANUS/TDAP  08/03/2022  . ZOSTAVAX  Completed  . PNA vac Low Risk Adult  Completed      Plan:     I have personally reviewed and addressed the Medicare Annual Wellness questionnaire and have noted the following in the patient's chart:  A. Medical and social history B. Use of alcohol, tobacco or illicit drugs  C. Current medications and supplements D. Functional ability and status E.  Nutritional status F.  Physical activity G. Advance directives H. List of other physicians I.  Hospitalizations, surgeries, and ER visits in previous 12  months J.  [place] to include hearing, vision, cognitive, depression L. Referrals and appointments - none  In addition, I have reviewed and discussed with patient certain preventive protocols, quality metrics, and best practice recommendations. A written personalized care plan for preventive services as well as general preventive health recommendations were provided to patient.  See attached scanned questionnaire for additional information.   Signed,   Lindell Noe, MHA, BS, LPN Health Advisor

## 2015-08-10 NOTE — Progress Notes (Signed)
PCP notes:  Health maintenance: No gaps identified or addressed  Abnormal screenings: None  Patient concerns: Pt would like dosage changed on Amlodopine from 10 mg to 5 mg so that he does not have to cut the pill in half.  Nurse concerns: None  Next PCP appt: 08/14/15 @ 0800

## 2015-08-10 NOTE — Progress Notes (Signed)
Pre visit review using our clinic review tool, if applicable. No additional management support is needed unless otherwise documented below in the visit note. 

## 2015-08-11 LAB — CBC WITH DIFFERENTIAL/PLATELET
BASOS PCT: 0.6 % (ref 0.0–3.0)
Basophils Absolute: 0 10*3/uL (ref 0.0–0.1)
EOS PCT: 5.6 % — AB (ref 0.0–5.0)
Eosinophils Absolute: 0.3 10*3/uL (ref 0.0–0.7)
HEMATOCRIT: 40.8 % (ref 39.0–52.0)
HEMOGLOBIN: 13.6 g/dL (ref 13.0–17.0)
LYMPHS PCT: 29.7 % (ref 12.0–46.0)
Lymphs Abs: 1.5 10*3/uL (ref 0.7–4.0)
MCHC: 33.3 g/dL (ref 30.0–36.0)
MCV: 91.1 fl (ref 78.0–100.0)
Monocytes Absolute: 0.6 10*3/uL (ref 0.1–1.0)
Monocytes Relative: 12.2 % — ABNORMAL HIGH (ref 3.0–12.0)
Neutro Abs: 2.5 10*3/uL (ref 1.4–7.7)
Neutrophils Relative %: 51.9 % (ref 43.0–77.0)
Platelets: 205 10*3/uL (ref 150.0–400.0)
RBC: 4.48 Mil/uL (ref 4.22–5.81)
RDW: 13.9 % (ref 11.5–15.5)
WBC: 4.9 10*3/uL (ref 4.0–10.5)

## 2015-08-11 NOTE — Progress Notes (Signed)
I reviewed health advisor's note, was available for consultation, and agree with documentation and plan.   Signed,  Matteo Banke T. Sierria Bruney, MD  

## 2015-08-14 ENCOUNTER — Ambulatory Visit (INDEPENDENT_AMBULATORY_CARE_PROVIDER_SITE_OTHER): Payer: Medicare Other | Admitting: Family Medicine

## 2015-08-14 ENCOUNTER — Encounter: Payer: Self-pay | Admitting: Family Medicine

## 2015-08-14 VITALS — BP 148/82 | HR 76 | Temp 97.8°F | Ht 70.0 in | Wt 181.5 lb

## 2015-08-14 DIAGNOSIS — Z0000 Encounter for general adult medical examination without abnormal findings: Secondary | ICD-10-CM

## 2015-08-14 MED ORDER — AMLODIPINE BESYLATE 5 MG PO TABS: ORAL_TABLET | ORAL | Status: DC

## 2015-08-14 NOTE — Progress Notes (Signed)
Pre visit review using our clinic review tool, if applicable. No additional management support is needed unless otherwise documented below in the visit note. 

## 2015-08-14 NOTE — Progress Notes (Signed)
Dr. Frederico Hamman T. Charlize Hathaway, MD, [facility] [place], [address] Phone: [phone] Fax: [phone]  08/14/2015  Patient: Jeffery [name], MRN: [id number], DOB: 09-Jan-1941, 75 y.o.  Primary Physician:  Owens Loffler, MD   Chief Complaint  Patient presents with  . Annual Exam    pt 2   Subjective:   Jeffery [name] is a 75 y.o. pleasant patient who presents with the following:  Preventative Health Maintenance Visit:  Health Maintenance Summary Reviewed and updated, unless pt declines services.  Tobacco History Reviewed. Alcohol: No concerns, no excessive use Exercise Habits: Some activity, rec at least 30 mins 5 times a week STD concerns: no risk or activity to increase risk Drug Use: None Encouraged self-testicular check  Doing well overall.  Occasional stomach discomfort with his Crohn's.  He also had some cubital tunnel syndrome, but this calm down, and he does occasionally  Sleep with his elbow straight now with towels.  Health Maintenance  Topic Date Due  . INFLUENZA VACCINE  08/25/2015  . COLONOSCOPY  03/03/2018  . TETANUS/TDAP  08/03/2022  . ZOSTAVAX  Completed  . PNA vac Low Risk Adult  Completed   Immunization History  Administered Date(s) Administered  . Influenza Split 11/10/2010, 10/03/2011  . Influenza Whole 12/29/2008  . Influenza,inj,Quad PF,36+ Mos 11/20/2012, 12/25/2013, 11/27/2014  . Pneumococcal Conjugate-13 08/07/2013  . Pneumococcal Polysaccharide-23 08/07/2008  . Tdap 08/02/2012  . Zoster 08/07/2008   Patient Active Problem List   Diagnosis Date Noted  . 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 OTHER SPECIFIED FORMS OF HEARING LOSS 08/10/2009  . HYPERCHOLESTEROLEMIA  IIA 11/25/2008  . BENIGN PROSTATIC HYPERTROPHY, HX OF, S/P TURP 08/07/2008  . INTERNAL HEMORRHOIDS 05/14/2008  . ESOPHAGEAL MOTILITY DISORDER 05/14/2008  . ANAL FISTULA 05/14/2008  . COLONIC POLYPS,  HYPERPLASTIC, HX OF 05/14/2008  . ESOPHAGITIS, HX OF 05/14/2008  . HYPERTENSION, BENIGN 05/06/2008  . CAD 05/06/2008  . Crohn's disease ([place]) 05/06/2008   Past Medical History  Diagnosis Date  . Anal fistula   . History of esophagitis   . Internal hemorrhoids   . Hyperplastic colon polyp   . Esophageal motility disorder   . CAD (coronary artery disease)   . Crohn disease ([place])   . Hyperlipidemia   . Hypertension   . Hearing loss   . History of BPH   . GERD (gastroesophageal reflux disease)   . Right inguinal hernia   . Chest pain     in past/? heartburn/EKG normal   Past Surgical History  Procedure Laterality Date  . Eye surgery      right eye  . Hernia repair      x 2  . Lipoma excision      chest  . Prostate surgery      x 2, TURP   Social History   Social History  . Marital Status: Married    Spouse Name: N/A  . Number of Children: 2  . Years of Education: N/A   Occupational History  . Retired-safety consulting    Social History Main Topics  . Smoking status: Former Smoker -- 1.00 packs/day for 20 years    Types: Cigarettes    Quit date: 01/24/1978  . Smokeless tobacco: Never Used  . Alcohol Use: 0.6 oz/week    1 Standard drinks or equivalent per week     Comment: occasional wine or beer weekly  . Drug Use: No  . Sexual Activity:  No   Other Topics Concern  . Not on file   Social History Narrative   Daily caffeine   Family History  Problem Relation Age of Onset  . Colon polyps Mother   . Colon cancer Neg Hx   . Esophageal cancer Neg Hx   . Rectal cancer Neg Hx   . Stomach cancer Neg Hx   . Heart attack Father 83   Allergies  Allergen Reactions  . Ace Inhibitors Other (See Comments)    REACTION: cough    Medication list has been reviewed and updated.   General: Denies fever, chills, sweats. No significant weight loss. Eyes: Denies blurring,significant itching ENT: Denies earache, sore throat, and hoarseness. Cardiovascular: Denies  chest pains, palpitations, dyspnea on exertion Respiratory: Denies cough, dyspnea at rest,wheeezing Breast: no concerns about lumps GI: Denies nausea, vomiting, diarrhea, constipation, change in bowel habits, abdominal pain, melena, hematochezia GU: Denies penile discharge, ED, urinary flow / outflow problems. No STD concerns. Musculoskeletal: Denies back pain, joint pain Derm: Denies rash, itching Neuro: Denies  paresthesias, frequent falls, frequent headaches Psych: Denies depression, anxiety Endocrine: Denies cold intolerance, heat intolerance, polydipsia Heme: Denies enlarged lymph nodes Allergy: No hayfever  Objective:   BP 148/82 mmHg  Pulse 76  Temp(Src) 97.8 F (36.6 C) (Oral)  Ht 5' 10"  (1.778 m)  Wt 181 lb 8 oz (82.328 kg)  BMI 26.04 kg/m2  SpO2 96% Ideal Body Weight: Weight in (lb) to have BMI = 25: 173.9  No exam data present  GEN: well developed, well nourished, no acute distress Eyes: conjunctiva and lids normal, PERRLA, EOMI ENT: TM clear, nares clear, oral exam WNL Neck: supple, no lymphadenopathy, no thyromegaly, no JVD Pulm: clear to auscultation and percussion, respiratory effort normal CV: regular rate and rhythm, S1-S2, no murmur, rub or gallop, no bruits, peripheral pulses normal and symmetric, no cyanosis, clubbing, edema or varicosities GI: soft, non-tender; no hepatosplenomegaly, masses; active bowel sounds all quadrants GU: no hernia, testicular mass, penile discharge Lymph: no cervical, axillary or inguinal adenopathy MSK: gait normal, muscle tone and strength WNL, no joint swelling, effusions, discoloration, crepitus  SKIN: clear, good turgor, color WNL, no rashes, lesions, or ulcerations Neuro: normal mental status, normal strength, sensation, and motion Psych: alert; oriented to person, place and time, normally interactive and not anxious or depressed in appearance.  All labs reviewed with patient.  Lipids:    Component Value Date/Time    CHOL 150 08/10/2015 0825   TRIG 96.0 08/10/2015 0825   HDL 46.50 08/10/2015 0825   LDLDIRECT 160.7 06/16/2008 0913   VLDL 19.2 08/10/2015 0825   CHOLHDL 3 08/10/2015 0825   CBC: CBC Latest Ref Rng 08/10/2015 07/01/2014 08/02/2013  WBC 4.0 - 10.5 K/uL 4.9 5.9 5.4  Hemoglobin 13.0 - 17.0 g/dL 13.6 13.2 13.1  Hematocrit 39.0 - 52.0 % 40.8 39.6 39.7  Platelets 150.0 - 400.0 K/uL 205.0 263.0 751.0    Basic Metabolic Panel:    Component Value Date/Time   NA 137 08/10/2015 0825   K 3.7 08/10/2015 0825   CL 103 08/10/2015 0825   CO2 28 08/10/2015 0825   BUN 17 08/10/2015 0825   CREATININE 0.88 08/10/2015 0825   GLUCOSE 88 08/10/2015 0825   CALCIUM 9.3 08/10/2015 0825   Hepatic Function Latest Ref Rng 08/10/2015 07/01/2014 08/02/2013  Total Protein 6.0 - 8.3 g/dL 6.7 6.8 6.1  Albumin 3.5 - 5.2 g/dL 4.3 4.3 4.0  AST 0 - 37 U/L 20 24 27   ALT 0 -  53 U/L 17 26 24   Alk Phosphatase 39 - 117 U/L 50 52 55  Total Bilirubin 0.2 - 1.2 mg/dL 1.3(H) 0.9 1.1  Bilirubin, Direct 0.0 - 0.3 mg/dL 0.2 0.2 0.2    Lab Results  Component Value Date   TSH 3.33 08/02/2013   Lab Results  Component Value Date   PSA 1.09 08/10/2015   PSA 1.23 08/18/2014   PSA 1.03 08/02/2013    Assessment and Plan:   Healthcare maintenance   Health Maintenance Exam: The patient's preventative maintenance and recommended screening tests for an annual wellness exam were reviewed in full today. Brought up to date unless services declined.  Counselled on the importance of diet, exercise, and its role in overall health and mortality. The patient's FH and SH was reviewed, including their home life, tobacco status, and drug and alcohol status.  Follow-up: 1 yr Unless noted, follow-up in 1 year for Health Maintenance Exam.  Modified Medications   Modified Medication Previous Medication   AMLODIPINE (NORVASC) 5 MG TABLET amLODipine (NORVASC) 10 MG tablet      TAKE 1 TABLET BY MOUTH EVERY DAY.    TAKE 1/2 TABLET BY MOUTH  EVERY DAY.    Signed,  Maud Deed. Rosalin Buster, MD   Patient's Medications  New Prescriptions   No medications on file  Previous Medications   ASPIRIN 81 MG TABLET    Take 81 mg by mouth daily.   ATORVASTATIN (LIPITOR) 20 MG TABLET    TAKE 1 TABLET DAILY   FOLIC ACID (FOLVITE) 1 MG TABLET    Take 1 tablet (1 mg total) by mouth daily.   HYDROCHLOROTHIAZIDE (MICROZIDE) 12.5 MG CAPSULE    Take 1 capsule (12.5 mg total) by mouth daily.   LATANOPROST (XALATAN) 0.005 % OPHTHALMIC SOLUTION    Place 1 drop into the right eye at bedtime.    LOSARTAN (COZAAR) 100 MG TABLET    Take 1 tablet (100 mg total) by mouth daily.   MULTIPLE VITAMINS-MINERALS (CENTRUM PO)    Take 1 tablet by mouth daily.     SULFASALAZINE (AZULFIDINE) 500 MG TABLET    TAKE 2 TABLETS TWICE A DAY  Modified Medications   Modified Medication Previous Medication   AMLODIPINE (NORVASC) 5 MG TABLET amLODipine (NORVASC) 10 MG tablet      TAKE 1 TABLET BY MOUTH EVERY DAY.    TAKE 1/2 TABLET BY MOUTH EVERY DAY.  Discontinued Medications   No medications on file

## 2015-10-13 ENCOUNTER — Other Ambulatory Visit: Payer: Self-pay | Admitting: Internal Medicine

## 2015-10-13 ENCOUNTER — Other Ambulatory Visit: Payer: Self-pay | Admitting: Family Medicine

## 2015-10-19 ENCOUNTER — Other Ambulatory Visit: Payer: Self-pay | Admitting: Family Medicine

## 2015-10-20 ENCOUNTER — Ambulatory Visit (INDEPENDENT_AMBULATORY_CARE_PROVIDER_SITE_OTHER): Payer: Medicare Other

## 2015-10-20 DIAGNOSIS — Z23 Encounter for immunization: Secondary | ICD-10-CM | POA: Diagnosis not present

## 2015-12-21 ENCOUNTER — Other Ambulatory Visit: Payer: Self-pay | Admitting: *Deleted

## 2015-12-21 MED ORDER — HYDROCHLOROTHIAZIDE 12.5 MG PO CAPS: 12.5000 mg | ORAL_CAPSULE | Freq: Every day | ORAL | 1 refills | Status: DC

## 2016-01-20 ENCOUNTER — Telehealth: Payer: Medicare Other | Admitting: Family

## 2016-01-20 DIAGNOSIS — R6889 Other general symptoms and signs: Secondary | ICD-10-CM

## 2016-01-20 MED ORDER — OSELTAMIVIR PHOSPHATE 75 MG PO CAPS: 75.0000 mg | ORAL_CAPSULE | Freq: Two times a day (BID) | ORAL | 0 refills | Status: DC

## 2016-01-20 NOTE — Progress Notes (Signed)

## 2016-02-15 ENCOUNTER — Encounter: Payer: Self-pay | Admitting: Family Medicine

## 2016-02-15 ENCOUNTER — Ambulatory Visit (INDEPENDENT_AMBULATORY_CARE_PROVIDER_SITE_OTHER): Payer: Medicare Other | Admitting: Family Medicine

## 2016-02-15 VITALS — BP 130/70 | HR 71 | Temp 98.3°F | Ht 70.0 in | Wt 190.5 lb

## 2016-02-15 DIAGNOSIS — R590 Localized enlarged lymph nodes: Secondary | ICD-10-CM

## 2016-02-15 DIAGNOSIS — M2550 Pain in unspecified joint: Secondary | ICD-10-CM | POA: Diagnosis not present

## 2016-02-15 LAB — CBC WITH DIFFERENTIAL/PLATELET
BASOS ABS: 0 10*3/uL (ref 0.0–0.1)
Basophils Relative: 0.6 % (ref 0.0–3.0)
EOS ABS: 0.1 10*3/uL (ref 0.0–0.7)
Eosinophils Relative: 2.2 % (ref 0.0–5.0)
HEMATOCRIT: 41.1 % (ref 39.0–52.0)
HEMOGLOBIN: 13.8 g/dL (ref 13.0–17.0)
LYMPHS PCT: 25.4 % (ref 12.0–46.0)
Lymphs Abs: 1.5 10*3/uL (ref 0.7–4.0)
MCHC: 33.6 g/dL (ref 30.0–36.0)
MCV: 90.6 fl (ref 78.0–100.0)
Monocytes Absolute: 0.8 10*3/uL (ref 0.1–1.0)
Monocytes Relative: 13.2 % — ABNORMAL HIGH (ref 3.0–12.0)
NEUTROS ABS: 3.6 10*3/uL (ref 1.4–7.7)
Neutrophils Relative %: 58.6 % (ref 43.0–77.0)
PLATELETS: 222 10*3/uL (ref 150.0–400.0)
RBC: 4.54 Mil/uL (ref 4.22–5.81)
RDW: 13.2 % (ref 11.5–15.5)
WBC: 6.1 10*3/uL (ref 4.0–10.5)

## 2016-02-15 LAB — BASIC METABOLIC PANEL
BUN: 15 mg/dL (ref 6–23)
CALCIUM: 9.3 mg/dL (ref 8.4–10.5)
CO2: 28 mEq/L (ref 19–32)
CREATININE: 0.91 mg/dL (ref 0.40–1.50)
Chloride: 102 mEq/L (ref 96–112)
GFR: 86.28 mL/min (ref >60.00)
Glucose, Bld: 100 mg/dL — ABNORMAL HIGH (ref 70–99)
Potassium: 4 mEq/L (ref 3.5–5.1)
Sodium: 136 mEq/L (ref 135–145)

## 2016-02-15 LAB — HEPATIC FUNCTION PANEL
ALK PHOS: 51 U/L (ref 39–117)
ALT: 20 U/L (ref 0–53)
AST: 20 U/L (ref 0–37)
Albumin: 4.3 g/dL (ref 3.5–5.2)
BILIRUBIN DIRECT: 0.3 mg/dL (ref 0.0–0.3)
TOTAL PROTEIN: 7 g/dL (ref 6.0–8.3)
Total Bilirubin: 1 mg/dL (ref 0.2–1.2)

## 2016-02-15 NOTE — Progress Notes (Signed)
Pre visit review using our clinic review tool, if applicable. No additional management support is needed unless otherwise documented below in the visit note. 

## 2016-02-15 NOTE — Progress Notes (Signed)
Dr. Frederico Hamman T. Ovetta Bazzano, MD, [facility] [place], [address] Phone: [phone] Fax: [phone]  02/15/2016  Patient: Jeffery [name], MRN: [id number], DOB: November 19, 1940, 76 y.o.  Primary Physician:  Owens Loffler, MD   Chief Complaint  Patient presents with  . Adenopathy    Under Left Arm  . Headache  . Hand/Wrist Pain   Subjective:   Jeffery [name] is a 76 y.o. very pleasant male patient who presents with the following:  1 month ago, started to get aches and headaches.   Called 1 month ago - called in some Tamiflu from an e-visit.  Since then, he has never felt completely normal. Headaches, fatigue, hand and wrist pain without swelling.  He does have a history of Crohn's, but never had arthropathy associated with this.  Under L arm is tender.  Started about 3-4 weeks ago.  Cannot feel any actual enlargement.  More of a headache now and soreness in his wrist.   Past Medical History, Surgical History, Social History, Family History, Problem List, Medications, and Allergies have been reviewed and updated if relevant.  Patient Active Problem List   Diagnosis Date Noted  . 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 OTHER SPECIFIED FORMS OF HEARING LOSS 08/10/2009  . HYPERCHOLESTEROLEMIA  IIA 11/25/2008  . BENIGN PROSTATIC HYPERTROPHY, HX OF, S/P TURP 08/07/2008  . INTERNAL HEMORRHOIDS 05/14/2008  . ESOPHAGEAL MOTILITY DISORDER 05/14/2008  . ANAL FISTULA 05/14/2008  . COLONIC POLYPS, HYPERPLASTIC, HX OF 05/14/2008  . ESOPHAGITIS, HX OF 05/14/2008  . HYPERTENSION, BENIGN 05/06/2008  . CAD 05/06/2008  . Crohn's disease ([place]) 05/06/2008    Past Medical History:  Diagnosis Date  . Anal fistula   . CAD (coronary artery disease)   . Chest pain    in past/? heartburn/EKG normal  . Crohn disease ([place])   . Esophageal motility disorder   . GERD (gastroesophageal reflux disease)   . Hearing loss   . History  of BPH   . History of esophagitis   . Hyperlipidemia   . Hyperplastic colon polyp   . Hypertension   . Internal hemorrhoids   . Right inguinal hernia     Past Surgical History:  Procedure Laterality Date  . EYE SURGERY     right eye  . HERNIA REPAIR     x 2  . LIPOMA EXCISION     chest  . PROSTATE SURGERY     x 2, TURP    Social History   Social History  . Marital status: Married    Spouse name: N/A  . Number of children: 2  . Years of education: N/A   Occupational History  . Retired-safety consulting Retired   Social History Main Topics  . Smoking status: Former Smoker    Packs/day: 1.00    Years: 20.00    Types: Cigarettes    Quit date: 01/24/1978  . Smokeless tobacco: Never Used  . Alcohol use 0.6 oz/week    1 Standard drinks or equivalent per week     Comment: occasional wine or beer weekly  . Drug use: No  . Sexual activity: No   Other Topics Concern  . Not on file   Social History Narrative   Daily caffeine    Family History  Problem Relation Age of Onset  . Colon polyps Mother   . Colon cancer Neg Hx   . Esophageal cancer Neg Hx   . Rectal  cancer Neg Hx   . Stomach cancer Neg Hx   . Heart attack Father 51    Allergies  Allergen Reactions  . Ace Inhibitors Other (See Comments)    REACTION: cough    Medication list reviewed and updated in full in [facility].   GEN: No acute illnesses, no fevers, chills. GI: No n/v/d, eating normally Pulm: No SOB Interactive and getting along well at home.  Otherwise, ROS is as per the HPI.  Objective:   BP 130/70   Pulse 71   Temp 98.3 F (36.8 C) (Oral)   Ht 5' 10"  (1.778 m)   Wt 190 lb 8 oz (86.4 kg)   BMI 27.33 kg/m   GEN: WDWN, NAD, Non-toxic, A & O x 3 HEENT: Atraumatic, Normocephalic. Neck supple. No masses, No LAD. Ears and Nose: No external deformity. CV: RRR, No M/G/R. No JVD. No thrill. No extra heart sounds. PULM: CTA B, no wheezes, crackles, rhonchi. No retractions. No  resp. distress. No accessory muscle use. EXTR: No c/c/e NEURO Normal gait.  PSYCH: Normally interactive. Conversant. Not depressed or anxious appearing.  Calm demeanor.   No synovitis on fingers, mcps, or wrist joints.  Full rom at elbows.  No redness or warmth.  Axilla: l sided, no lad appreciated, some tenderness medially - not in the muscle  Laboratory and Imaging Data: Results for orders placed or performed in visit on 63/14/97  Basic metabolic panel  Result Value Ref Range   Sodium 136 135 - 145 mEq/L   Potassium 4.0 3.5 - 5.1 mEq/L   Chloride 102 96 - 112 mEq/L   CO2 28 19 - 32 mEq/L   Glucose, Bld 100 (H) 70 - 99 mg/dL   BUN 15 6 - 23 mg/dL   Creatinine, Ser 0.91 0.40 - 1.50 mg/dL   Calcium 9.3 8.4 - 10.5 mg/dL   GFR 86.28 >60.00 mL/min  CBC with Differential/Platelet  Result Value Ref Range   WBC 6.1 4.0 - 10.5 K/uL   RBC 4.54 4.22 - 5.81 Mil/uL   Hemoglobin 13.8 13.0 - 17.0 g/dL   HCT 41.1 39.0 - 52.0 %   MCV 90.6 78.0 - 100.0 fl   MCHC 33.6 30.0 - 36.0 g/dL   RDW 13.2 11.5 - 15.5 %   Platelets 222.0 150.0 - 400.0 K/uL   Neutrophils Relative % 58.6 43.0 - 77.0 %   Lymphocytes Relative 25.4 12.0 - 46.0 %   Monocytes Relative 13.2 (H) 3.0 - 12.0 %   Eosinophils Relative 2.2 0.0 - 5.0 %   Basophils Relative 0.6 0.0 - 3.0 %   Neutro Abs 3.6 1.4 - 7.7 K/uL   Lymphs Abs 1.5 0.7 - 4.0 K/uL   Monocytes Absolute 0.8 0.1 - 1.0 K/uL   Eosinophils Absolute 0.1 0.0 - 0.7 K/uL   Basophils Absolute 0.0 0.0 - 0.1 K/uL  Hepatic function panel  Result Value Ref Range   Total Bilirubin 1.0 0.2 - 1.2 mg/dL   Bilirubin, Direct 0.3 0.0 - 0.3 mg/dL   Alkaline Phosphatase 51 39 - 117 U/L   AST 20 0 - 37 U/L   ALT 20 0 - 53 U/L   Total Protein 7.0 6.0 - 8.3 g/dL   Albumin 4.3 3.5 - 5.2 g/dL     Assessment and Plan:   Lymphadenopathy, axillary - Plan: Basic metabolic panel, CBC with Differential/Platelet, Hepatic function panel  Arthralgia, unspecified joint - Plan: Basic  metabolic panel, CBC with Differential/Platelet, Hepatic function panel  HA, polyarthralgia, fatigue, ? LAD that I cannot palpate but tender in this area. Labs reassuring. CBC normal - observe only at this point and if still symptomatic in a month, explore further.   Follow-up: No Follow-up on file.  Medications Discontinued During This Encounter  Medication Reason  . oseltamivir (TAMIFLU) 75 MG capsule Duplicate  . folic acid (FOLVITE) 1 MG tablet Duplicate   Orders Placed This Encounter  Procedures  . Basic metabolic panel  . CBC with Differential/Platelet  . Hepatic function panel    Signed,  Frederico Hamman T. Melonee Gerstel, MD   Allergies as of 02/15/2016      Reactions   Ace Inhibitors Other (See Comments)   REACTION: cough      Medication List       Accurate as of 02/15/16 11:59 PM. Always use your most recent med list.          amLODipine 5 MG tablet Commonly known as:  NORVASC TAKE 1 TABLET BY MOUTH EVERY DAY.   aspirin 81 MG tablet Take 81 mg by mouth daily.   atorvastatin 20 MG tablet Commonly known as:  LIPITOR TAKE 1 TABLET DAILY   CENTRUM PO Take 1 tablet by mouth daily.   folic acid 1 MG tablet Commonly known as:  FOLVITE TAKE 1 TABLET BY MOUTH DAILY   hydrochlorothiazide 12.5 MG capsule Commonly known as:  MICROZIDE Take 1 capsule (12.5 mg total) by mouth daily.   latanoprost 0.005 % ophthalmic solution Commonly known as:  XALATAN Place 1 drop into the right eye at bedtime.   losartan 100 MG tablet Commonly known as:  COZAAR TAKE 1 TABLET DAILY   sulfaSALAzine 500 MG tablet Commonly known as:  AZULFIDINE TAKE 2 TABLETS TWICE A DAY

## 2016-03-18 ENCOUNTER — Ambulatory Visit (INDEPENDENT_AMBULATORY_CARE_PROVIDER_SITE_OTHER): Payer: Medicare Other | Admitting: Internal Medicine

## 2016-03-18 ENCOUNTER — Encounter: Payer: Self-pay | Admitting: Cardiovascular Disease

## 2016-03-18 ENCOUNTER — Encounter: Payer: Self-pay | Admitting: Internal Medicine

## 2016-03-18 ENCOUNTER — Ambulatory Visit (INDEPENDENT_AMBULATORY_CARE_PROVIDER_SITE_OTHER): Payer: Medicare Other | Admitting: Cardiovascular Disease

## 2016-03-18 VITALS — BP 126/72 | HR 64 | Ht 70.0 in | Wt 189.0 lb

## 2016-03-18 VITALS — BP 116/70 | HR 84 | Ht 70.0 in | Wt 190.0 lb

## 2016-03-18 DIAGNOSIS — E7800 Pure hypercholesterolemia, unspecified: Secondary | ICD-10-CM

## 2016-03-18 DIAGNOSIS — I2510 Atherosclerotic heart disease of native coronary artery without angina pectoris: Secondary | ICD-10-CM

## 2016-03-18 DIAGNOSIS — I10 Essential (primary) hypertension: Secondary | ICD-10-CM

## 2016-03-18 DIAGNOSIS — K5010 Crohn's disease of large intestine without complications: Secondary | ICD-10-CM

## 2016-03-18 NOTE — Patient Instructions (Signed)

## 2016-03-18 NOTE — Patient Instructions (Signed)
Continue sulfasalazine and folic acid at current dosages.  Follow up with Dr Hilarie Fredrickson in 1 year.  If you are age 76 or older, your body mass index should be between 23-30. Your Body mass index is 27.26 kg/m. If this is out of the aforementioned range listed, please consider follow up with your Primary Care Provider.  If you are age 45 or younger, your body mass index should be between 19-25. Your Body mass index is 27.26 kg/m. If this is out of the aformentioned range listed, please consider follow up with your Primary Care Provider.

## 2016-03-18 NOTE — Progress Notes (Signed)
Subjective:    Patient ID: RIC ROSENBERG, male    DOB: 07/30/1940, 76 y.o.   MRN: [id number]  HPI Jeffery [name] is a 76 year old male with a history of Crohn's colitis, primarily left-sided diagnosed in 1986, GERD, hypertension and hyperlipidemia who is here for follow-up. He was last seen a year ago at the time of surveillance colonoscopy. He is maintained on sulfasalazine twice daily  His colonoscopy was performed on 03/04/2015. This revealed 2 sessile polyps in the ascending and descending colon which were removed with cold snare. These were benign colonic mucosa without adenomatous change. The colon otherwise appeared normal throughout and surveillance biopsies were performed which showed benign colorectal mucosa with no evidence of active or chronic inflammation. He did have internal hemorrhoids seen at the time of this colonoscopy.  He reports he has been doing well. He's had no change in his bowel habits. He's having a formed stool daily. No blood in his stool, rectal bleeding or melena. He denies abdominal pain. He does have a chronic left sided mid abdominal discomfort which he describes as a "pressure". This is been there greater than 10 years. When he is working or distracted it is not noticeable. He notices it when he is sitting quietly or when he lies down at night. It has not changed or worsened in severity. Appetite has been very good. No upper GI complaint. No hepatobiliary complaint. He is using sulfasalazine 1 g twice a day and 1 mg of folate daily.   Review of Systems As per HPI, otherwise negative  Current Medications, Allergies, Past Medical History, Past Surgical History, Family History and Social History were reviewed in [employer] record.     Objective:   Physical Exam BP 116/70   Pulse 84   Ht 5' 10"  (1.778 m)   Wt 190 lb (86.2 kg)   BMI 27.26 kg/m  Constitutional: Well-developed and well-nourished. No distress. HEENT: Normocephalic and  atraumatic. Conjunctivae are normal.  No scleral icterus. Neck: Neck supple. Trachea midline. Cardiovascular: Normal rate, regular rhythm and intact distal pulses. No M/R/G Pulmonary/chest: Effort normal and breath sounds normal. No wheezing, rales or rhonchi. Abdominal: Soft, nontender, nondistended. Bowel sounds active throughout. There are no masses palpable. No hepatosplenomegaly. Extremities: no clubbing, cyanosis, or edema Lymphadenopathy: No cervical adenopathy noted. Neurological: Alert and oriented to person place and time. Skin: Skin is warm and dry. No rashes noted.  Scattered submucosal nodules consistent with lipomas ant abd wall Psychiatric: Normal mood and affect. Behavior is normal.  CBC    Component Value Date/Time   WBC 6.1 02/15/2016 1210   RBC 4.54 02/15/2016 1210   HGB 13.8 02/15/2016 1210   HCT 41.1 02/15/2016 1210   PLT 222.0 02/15/2016 1210   MCV 90.6 02/15/2016 1210   MCHC 33.6 02/15/2016 1210   RDW 13.2 02/15/2016 1210   LYMPHSABS 1.5 02/15/2016 1210   MONOABS 0.8 02/15/2016 1210   EOSABS 0.1 02/15/2016 1210   BASOSABS 0.0 02/15/2016 1210   CMP     Component Value Date/Time   NA 136 02/15/2016 1210   K 4.0 02/15/2016 1210   CL 102 02/15/2016 1210   CO2 28 02/15/2016 1210   GLUCOSE 100 (H) 02/15/2016 1210   BUN 15 02/15/2016 1210   CREATININE 0.91 02/15/2016 1210   CALCIUM 9.3 02/15/2016 1210   PROT 7.0 02/15/2016 1210   ALBUMIN 4.3 02/15/2016 1210   AST 20 02/15/2016 1210   ALT 20 02/15/2016 1210   ALKPHOS 51  02/15/2016 1210   BILITOT 1.0 02/15/2016 1210   GFRNONAA 82.62 08/18/2009 0851   GFRAA 109 09/17/2007 0845       Assessment & Plan:  76 year old male with a history of Crohn's colitis, primarily left-sided diagnosed in 1986, GERD, hypertension and hyperlipidemia who is here for follow-up.  1. Crohn's left-sided colitis -- he has maintained clinical remission and histologic remission at time of last colonoscopy 1 year ago. We discussed  ongoing maintenance medication and due to the relative low risk of sulfasalazine therapy and the risk of flaring when removing maintenance medication we have decided to continue sulfasalazine ongoing. Continue sulfasalazine 1 g twice a day and folate 1 mg daily. --We discussed surveillance colonoscopy. Given the lack of active or chronic colitis at the time of his last exam and lack of adenomatous polyp, he is felt average risk from a colon cancer standpoint. --We'll discuss surveillance colonoscopy 3 years from his last around February 2020  2. Chronic left-sided abdominal discomfort -- unclear etiology. No palpable hernia. This pain is stable. We discussed cross-sectional imaging but given that this is not bothering him and chronic he prefers to "leave it alone". I asked that he notify me if it changes or worsens. He is happy with this plan  1 year follow-up, sooner if necessary 15 minutes spent with the patient today. Greater than 50% was spent in counseling and coordination of care with the patient

## 2016-03-18 NOTE — Progress Notes (Signed)
Chief Complaint  Patient presents with  . Shortness of Breath     History of Present Illness: 77 yo male with history of CAD, HTN, HLD here today for cardiac follow up. He has been followed in the past by Dr. Lia Foyer. He presented with chest pain in August 2009. Cardiac cath with mild non-obstructive disease. He has Crohn's disease in remission. Nuclear stress test February 2016 without ischemia.   He is here today for follow up. He describes occasional chest pains at rest that last for several hours. Also exertional dyspnea. He is very active.   Primary Care Physician: Owens Loffler, MD   Past Medical History:  Diagnosis Date  . Anal fistula   . CAD (coronary artery disease)   . Chest pain    in past/? heartburn/EKG normal  . Crohn disease ([place])   . Esophageal motility disorder   . GERD (gastroesophageal reflux disease)   . Hearing loss   . History of BPH   . History of esophagitis   . Hyperlipidemia   . Hyperplastic colon polyp   . Hypertension   . Internal hemorrhoids   . Right inguinal hernia     Past Surgical History:  Procedure Laterality Date  . EYE SURGERY     right eye  . HERNIA REPAIR     x 2  . LIPOMA EXCISION     chest  . PROSTATE SURGERY     x 2, TURP    Current Outpatient Prescriptions  Medication Sig Dispense Refill  . amLODipine (NORVASC) 5 MG tablet TAKE 1 TABLET BY MOUTH EVERY DAY. 90 tablet 3  . aspirin 81 MG tablet Take 81 mg by mouth daily.    Marland Kitchen atorvastatin (LIPITOR) 20 MG tablet TAKE 1 TABLET DAILY 90 tablet 3  . folic acid (FOLVITE) 1 MG tablet TAKE 1 TABLET BY MOUTH DAILY 100 tablet 3  . hydrochlorothiazide (MICROZIDE) 12.5 MG capsule Take 1 capsule (12.5 mg total) by mouth daily. 90 capsule 1  . latanoprost (XALATAN) 0.005 % ophthalmic solution Place 1 drop into the right eye at bedtime.     Marland Kitchen losartan (COZAAR) 100 MG tablet TAKE 1 TABLET DAILY 90 tablet 3  . Multiple Vitamins-Minerals (CENTRUM PO) Take 1 tablet by mouth daily.        Marland Kitchen sulfaSALAzine (AZULFIDINE) 500 MG tablet TAKE 2 TABLETS TWICE A DAY 360 tablet 1   No current facility-administered medications for this visit.     Allergies  Allergen Reactions  . Ace Inhibitors Other (See Comments)    REACTION: cough    Social History   Social History  . Marital status: Married    Spouse name: N/A  . Number of children: 2  . Years of education: N/A   Occupational History  . Retired-safety consulting Retired   Social History Main Topics  . Smoking status: Former Smoker    Packs/day: 1.00    Years: 20.00    Types: Cigarettes    Quit date: 01/24/1978  . Smokeless tobacco: Never Used  . Alcohol use 0.6 oz/week    1 Standard drinks or equivalent per week     Comment: occasional wine or beer weekly  . Drug use: No  . Sexual activity: No   Other Topics Concern  . Not on file   Social History Narrative   Daily caffeine    Family History  Problem Relation Age of Onset  . Colon polyps Mother   . Colon cancer Neg Hx   .  Esophageal cancer Neg Hx   . Rectal cancer Neg Hx   . Stomach cancer Neg Hx   . Heart attack Father 56    Review of Systems:  As stated in the HPI and otherwise negative.   BP 126/72   Pulse 64   Ht 5' 10"  (1.778 m)   Wt 189 lb (85.7 kg)   BMI 27.12 kg/m   Physical Examination: General: Well developed, well nourished, NAD  HEENT: OP clear, mucus membranes moist  SKIN: warm, dry. No rashes. Neuro: No focal deficits  Musculoskeletal: Muscle strength 5/5 all ext  Psychiatric: Mood and affect normal  Neck: No JVD, no carotid bruits, no thyromegaly, no lymphadenopathy.  Lungs:Clear bilaterally, no wheezes, rhonci, crackles Cardiovascular: Regular rate and rhythm. No murmurs, gallops or rubs. Abdomen:Soft. Bowel sounds present. Non-tender.  Extremities: No lower extremity edema. Pulses are 2 + in the bilateral DP/PT  Cardiac cath 8/09: 1. Ventriculography was done in the RAO projection. Overall systolic  function appeared  preserved. No definite segmental abnormalities  or contraction were identified.  2. The left main does demonstrate a little bit of tenting at the  ostium. Multiple views were obtained, and there was good efflux of  contrast back into the aorta. High-grade obstruction is not noted,  but the tenting would represent probably about a 30% overall  luminal reduction. The left main itself appears to be smooth.  3. The left anterior descending artery courses to the apex. The first  tiny diagonal branch in the LAO view does demonstrate some  narrowing, probably of 90%, but this is a 1-mm vessel or less. The  second diagonal branch is a large-caliber vessel free of critical  disease in the mid and distal LAD. All appear free of significant  high-grade disease.  4. The ramus intermedius is a moderate-size vessel with minimal  proximal luminal irregularity.  5. The circumflex coronary artery provides 2 marginal branches and has  no significant obstruction.  6. The right coronary artery also was minimally tapered at the ostium.  The right coronary, however, is smooth throughout providing a  posterior descending and posterolateral branch with minimal or mild  plaquing at the origin of the PDA, which is not hemodynamically  significant.  Nuclear stress study: February 2016: Stress Procedure: The patient exercised on the treadmill utilizing the Bruce Protocol for 7:03 minutes. The patient stopped due to sob and denied any chest pain. Technetium 12mSestamibi was injected at peak exercise and myocardial perfusion imaging was performed after a brief delay. Stress ECG: No significant change from baseline ECG  QPS Raw Data Images: Normal; no motion artifact; normal heart/lung ratio. Stress Images: Normal homogeneous uptake in all areas of the myocardium. Rest Images: Normal homogeneous uptake in all areas of the myocardium. Subtraction (SDS): No evidence of ischemia. Transient Ischemic Dilatation  (Normal <1.22): 0.81 Lung/Heart Ratio (Normal <0.45): 0.28  Quantitative Gated Spect Images QGS EDV: 105 ml QGS ESV: 48 ml  Impression Exercise Capacity: Good exercise capacity. BP Response: Normal blood pressure response. Clinical Symptoms: No chest pain. ECG Impression: No significant ST segment change suggestive of ischemia. Comparison with Prior Nuclear Study: No images to compare  Overall Impression: Normal stress nuclear study.  LV Ejection Fraction: 55%. LV Wall Motion: NL LV Function; NL Wall Motion  EKG:  EKG is ordered today. The ekg ordered today demonstrates NSR, rate 64 bpm  Recent Labs: 02/15/2016: ALT 20; BUN 15; Creatinine, Ser 0.91; Hemoglobin 13.8; Platelets 222.0; Potassium 4.0; Sodium 136  Lipid Panel    Component Value Date/Time   CHOL 150 08/10/2015 0825   TRIG 96.0 08/10/2015 0825   HDL 46.50 08/10/2015 0825   CHOLHDL 3 08/10/2015 0825   VLDL 19.2 08/10/2015 0825   LDLCALC 84 08/10/2015 0825   LDLDIRECT 160.7 06/16/2008 0913     Wt Readings from Last 3 Encounters:  03/18/16 189 lb (85.7 kg)  02/15/16 190 lb 8 oz (86.4 kg)  08/14/15 181 lb 8 oz (82.3 kg)     Other studies Reviewed: Additional studies/ records that were reviewed today include: . Review of the above records demonstrates:    Assessment and Plan:   1. CAD: Stable. He is known to have mild CAD by cath in 2009. Stress test February 2016 with no ischemia, normal LV systolic function. Will continue ASA, statin, ARB.   2. HTN: BP is well controlled. No changes today. Continue current meds.   3. HLD: Lipids well controlled. Continue statin.   Current medicines are reviewed at length with the patient today.  The patient does not have concerns regarding medicines.  The following changes have been made:  no change  Labs/ tests ordered today include:   Orders Placed This Encounter  Procedures  . EKG 12-Lead    Disposition:   FU with me in 12   months  Signed, Lauree Chandler, MD 03/18/2016 9:56 AM    [facility] [place], [place], [place]  [address] Phone: [phone]; Fax: [phone]

## 2016-03-23 ENCOUNTER — Other Ambulatory Visit: Payer: Self-pay | Admitting: *Deleted

## 2016-03-23 MED ORDER — AMLODIPINE BESYLATE 5 MG PO TABS: ORAL_TABLET | ORAL | 1 refills | Status: DC

## 2016-03-23 MED ORDER — SULFASALAZINE 500 MG PO TABS: 1000.0000 mg | ORAL_TABLET | Freq: Two times a day (BID) | ORAL | 3 refills | Status: DC

## 2016-03-23 MED ORDER — LOSARTAN POTASSIUM 100 MG PO TABS: 100.0000 mg | ORAL_TABLET | Freq: Every day | ORAL | 1 refills | Status: DC

## 2016-03-23 MED ORDER — HYDROCHLOROTHIAZIDE 12.5 MG PO CAPS
12.5000 mg | ORAL_CAPSULE | Freq: Every day | ORAL | 1 refills | Status: DC
Start: 2016-03-23 — End: 2016-07-28

## 2016-03-23 MED ORDER — FOLIC ACID 1 MG PO TABS: 1.0000 mg | ORAL_TABLET | Freq: Every day | ORAL | 3 refills | Status: DC

## 2016-03-23 NOTE — Addendum Note (Signed)
Addended by: Carter Kitten on: 03/23/2016 03:58 PM   Modules accepted: Orders

## 2016-03-24 MED ORDER — ATORVASTATIN CALCIUM 20 MG PO TABS: 20.0000 mg | ORAL_TABLET | Freq: Every day | ORAL | 1 refills | Status: DC

## 2016-03-24 NOTE — Addendum Note (Signed)
Addended by: Carter Kitten on: 03/24/2016 11:31 AM   Modules accepted: Orders

## 2016-05-03 DIAGNOSIS — H401121 Primary open-angle glaucoma, left eye, mild stage: Secondary | ICD-10-CM | POA: Insufficient documentation

## 2016-05-03 DIAGNOSIS — H401112 Primary open-angle glaucoma, right eye, moderate stage: Secondary | ICD-10-CM | POA: Insufficient documentation

## 2016-05-03 DIAGNOSIS — H2512 Age-related nuclear cataract, left eye: Secondary | ICD-10-CM | POA: Diagnosis not present

## 2016-07-28 ENCOUNTER — Telehealth: Payer: Self-pay | Admitting: Family Medicine

## 2016-07-28 ENCOUNTER — Other Ambulatory Visit: Payer: Self-pay | Admitting: Family Medicine

## 2016-07-28 NOTE — Telephone Encounter (Signed)
Left pt message asking to call Ebony Hail back directly at [phone] to schedule AWV + labs with Katha Cabal and CPE with PCP.  *NOTE* Last AWV 08/10/15

## 2016-08-04 NOTE — Progress Notes (Signed)
Subjective:   Jeffery [name] is a 76 y.o. male who presents for Medicare Annual/Subsequent preventive examination.  Review of Systems:  No ROS.  Medicare Wellness Visit. Additional risk factors are reflected in the social history.  Cardiac Risk Factors include: advanced age (>28mn, >>41women);hypertension;male gender;dyslipidemia     Objective:    Vitals: BP 136/82   Pulse 67   Ht 5' 10"  (1.778 m)   Wt 184 lb (83.5 kg)   SpO2 98%   BMI 26.40 kg/m   Body mass index is 26.4 kg/m.  Tobacco History  Smoking Status  . Former Smoker  . Packs/day: 1.00  . Years: 20.00  . Types: Cigarettes  . Quit date: 01/24/1978  Smokeless Tobacco  . Never Used     Counseling given: Not Answered   Past Medical History:  Diagnosis Date  . Anal fistula   . CAD (coronary artery disease)   . Chest pain    in past/? heartburn/EKG normal  . Crohn disease ([place]   . Esophageal motility disorder   . GERD (gastroesophageal reflux disease)   . Hearing loss   . History of BPH   . History of esophagitis   . Hyperlipidemia   . Hyperplastic colon polyp   . Hypertension   . Internal hemorrhoids   . Right inguinal hernia    Past Surgical History:  Procedure Laterality Date  . EYE SURGERY     right eye  . HERNIA REPAIR     x 2  . LIPOMA EXCISION     chest  . PROSTATE SURGERY     x 2, TURP   Family History  Problem Relation Age of Onset  . Colon polyps Mother   . Heart attack Father 59 . Colon cancer Neg Hx   . Esophageal cancer Neg Hx   . Rectal cancer Neg Hx   . Stomach cancer Neg Hx    History  Sexual Activity  . Sexual activity: No    Outpatient Encounter Prescriptions as of 08/15/2016  Medication Sig  . amLODipine (NORVASC) 5 MG tablet TAKE 1 TABLET BY MOUTH  EVERY DAY  . aspirin 81 MG tablet Take 81 mg by mouth daily.  .Marland Kitchenatorvastatin (LIPITOR) 20 MG tablet TAKE 1 TABLET BY MOUTH  DAILY  . folic acid (FOLVITE) 1 MG tablet Take 1 tablet (1 mg total) by mouth daily.  .  hydrochlorothiazide (MICROZIDE) 12.5 MG capsule TAKE 1 CAPSULE BY MOUTH  DAILY  . latanoprost (XALATAN) 0.005 % ophthalmic solution Place 1 drop into the right eye at bedtime.   .Marland Kitchenlosartan (COZAAR) 100 MG tablet TAKE 1 TABLET BY MOUTH  DAILY  . Multiple Vitamins-Minerals (CENTRUM PO) Take 1 tablet by mouth daily.    .Marland KitchensulfaSALAzine (AZULFIDINE) 500 MG tablet Take 2 tablets (1,000 mg total) by mouth 2 (two) times daily.   No facility-administered encounter medications on file as of 08/15/2016.     Activities of Daily Living In your present state of health, do you have any difficulty performing the following activities: 08/15/2016  Hearing? N  Vision? N  Difficulty concentrating or making decisions? N  Walking or climbing stairs? N  Dressing or bathing? N  Doing errands, shopping? N  Preparing Food and eating ? N  Using the Toilet? N  In the past six months, have you accidently leaked urine? N  Do you have problems with loss of bowel control? N  Managing your Medications? N  Managing your Finances? N  Housekeeping or managing your Housekeeping? N  Some recent data might be hidden    Patient Care Team: Owens Loffler, MD as PCP - General Evangeline Gula, MD as Consulting Physician (Ophthalmology) Burnell Blanks, MD as Consulting Physician (Cardiology) Pyrtle, Lajuan Lines, MD as Consulting Physician (Gastroenterology)   Assessment:    Physical assessment deferred to PCP.  Exercise Activities and Dietary recommendations Current Exercise Habits: Home exercise routine, Type of exercise: walking;Other - see comments (bicycling), Time (Minutes): 30, Frequency (Times/Week): 4, Weekly Exercise (Minutes/Week): 120  Goals    . Increase physical activity          Starting 08/10/15, I will continue to exercise for at least 30 min 3 days per week.       Fall Risk Fall Risk  08/15/2016 08/10/2015 08/20/2014 08/07/2013  Falls in the past year? No No No No   Depression Screen PHQ  2/9 Scores 08/15/2016 08/10/2015 08/20/2014 08/07/2013  PHQ - 2 Score 0 0 0 0    Cognitive Function PLEASE NOTE: A Mini-Cog screen was completed. Maximum score is 20. A value of 0 denotes this part of Folstein MMSE was not completed or the patient failed this part of the Mini-Cog screening.   Mini-Cog Screening Orientation to Time - Max 5 pts Orientation to Place - Max 5 pts Registration - Max 3 pts Recall - Max 3 pts Language Repeat - Max 1 pts Language Follow 3 Step Command - Max 3 pts      Mini-Cog - 08/15/16 1029    Normal clock drawing test? yes   How many words correct? 3      MMSE - Mini Mental State Exam 08/15/2016 08/10/2015  Orientation to time 5 5  Orientation to Place 5 5  Registration 3 3  Attention/ Calculation 0 0  Recall 3 3  Language- name 2 objects 0 0  Language- repeat 1 1  Language- follow 3 step command 3 3  Language- read & follow direction 0 0  Write a sentence 0 0  Copy design 0 0  Total score 20 20        Immunization History  Administered Date(s) Administered  . Influenza Split 11/10/2010, 10/03/2011  . Influenza Whole 12/29/2008  . Influenza,inj,Quad PF,36+ Mos 11/20/2012, 12/25/2013, 11/27/2014, 10/20/2015  . Influenza-Unspecified 11/07/2013  . Pneumococcal Conjugate-13 08/07/2013  . Pneumococcal Polysaccharide-23 08/07/2008  . Tdap 08/02/2012  . Zoster 08/07/2008   Screening Tests Health Maintenance  Topic Date Due  . INFLUENZA VACCINE  08/24/2016  . COLONOSCOPY  03/03/2018  . TETANUS/TDAP  08/03/2022  . PNA vac Low Risk Adult  Completed      Plan:    Follow-up w/ PCP as scheduled.  I have personally reviewed and noted the following in the patient's chart:   . Medical and social history . Use of alcohol, tobacco or illicit drugs  . Current medications and supplements . Functional ability and status . Nutritional status . Physical activity . Advanced directives . List of other physicians . Vitals . Screenings to include  cognitive, depression, and falls . Referrals and appointments  In addition, I have reviewed and discussed with patient certain preventive protocols, quality metrics, and best practice recommendations. A written personalized care plan for preventive services as well as general preventive health recommendations were provided to patient.     Dorrene German, RN  08/15/2016

## 2016-08-04 NOTE — Progress Notes (Signed)
PCP notes:   Health maintenance: No gaps identified.   Abnormal screenings: None   Patient concerns: He is not sleeping as well as he used to. He wakes up more during the night and would like PCP to be aware.   Nurse concerns: None   Next PCP appt: 08/31/2016 @10 :30am

## 2016-08-15 ENCOUNTER — Other Ambulatory Visit (INDEPENDENT_AMBULATORY_CARE_PROVIDER_SITE_OTHER): Payer: Medicare Other

## 2016-08-15 ENCOUNTER — Ambulatory Visit (INDEPENDENT_AMBULATORY_CARE_PROVIDER_SITE_OTHER): Payer: Medicare Other

## 2016-08-15 ENCOUNTER — Other Ambulatory Visit: Payer: Self-pay | Admitting: Family Medicine

## 2016-08-15 VITALS — BP 136/82 | HR 67 | Ht 70.0 in | Wt 184.0 lb

## 2016-08-15 DIAGNOSIS — Z0000 Encounter for general adult medical examination without abnormal findings: Secondary | ICD-10-CM

## 2016-08-15 LAB — CBC WITH DIFFERENTIAL/PLATELET
BASOS ABS: 0 10*3/uL (ref 0.0–0.1)
Basophils Relative: 0.6 % (ref 0.0–3.0)
EOS ABS: 0.2 10*3/uL (ref 0.0–0.7)
Eosinophils Relative: 3.9 % (ref 0.0–5.0)
HCT: 43.9 % (ref 39.0–52.0)
Hemoglobin: 14.5 g/dL (ref 13.0–17.0)
Lymphocytes Relative: 27 % (ref 12.0–46.0)
Lymphs Abs: 1.4 10*3/uL (ref 0.7–4.0)
MCHC: 33 g/dL (ref 30.0–36.0)
MCV: 93 fl (ref 78.0–100.0)
MONO ABS: 0.7 10*3/uL (ref 0.1–1.0)
Monocytes Relative: 13 % — ABNORMAL HIGH (ref 3.0–12.0)
NEUTROS PCT: 55.5 % (ref 43.0–77.0)
Neutro Abs: 2.9 10*3/uL (ref 1.4–7.7)
Platelets: 236 10*3/uL (ref 150.0–400.0)
RBC: 4.72 Mil/uL (ref 4.22–5.81)
RDW: 13.4 % (ref 11.5–15.5)
WBC: 5.2 10*3/uL (ref 4.0–10.5)

## 2016-08-15 LAB — LIPID PANEL
CHOLESTEROL: 166 mg/dL (ref 0–200)
HDL: 52.8 mg/dL (ref >39.00)
LDL Cholesterol: 95 mg/dL (ref 0–99)
NonHDL: 113.4
TRIGLYCERIDES: 90 mg/dL (ref 0.0–149.0)
Total CHOL/HDL Ratio: 3
VLDL: 18 mg/dL (ref 0.0–40.0)

## 2016-08-15 LAB — BASIC METABOLIC PANEL
BUN: 13 mg/dL (ref 6–23)
CALCIUM: 9.9 mg/dL (ref 8.4–10.5)
CO2: 30 mEq/L (ref 19–32)
CREATININE: 0.91 mg/dL (ref 0.40–1.50)
Chloride: 101 mEq/L (ref 96–112)
GFR: 86.17 mL/min (ref >60.00)
Glucose, Bld: 104 mg/dL — ABNORMAL HIGH (ref 70–99)
Potassium: 4.6 mEq/L (ref 3.5–5.1)
Sodium: 138 mEq/L (ref 135–145)

## 2016-08-15 LAB — PSA, MEDICARE: PSA: 1.23 ng/mL (ref 0.10–4.00)

## 2016-08-15 LAB — HEPATIC FUNCTION PANEL
ALK PHOS: 54 U/L (ref 39–117)
ALT: 23 U/L (ref 0–53)
AST: 23 U/L (ref 0–37)
Albumin: 4.5 g/dL (ref 3.5–5.2)
BILIRUBIN DIRECT: 0.2 mg/dL (ref 0.0–0.3)
TOTAL PROTEIN: 6.6 g/dL (ref 6.0–8.3)
Total Bilirubin: 1.3 mg/dL — ABNORMAL HIGH (ref 0.2–1.2)

## 2016-08-15 NOTE — Patient Instructions (Addendum)
[name] , Thank you for taking time to come for your Medicare Wellness Visit. I appreciate your ongoing commitment to your health goals. Please review the following plan we discussed and let me know if I can assist you in the future.   These are the goals we discussed: Goals    . Increase physical activity (pt-stated)          Starting 08/15/2016, I will continue to exercise for at least 30 min 3 days per week.        This is a list of the screening recommended for you and due dates:  Health Maintenance  Topic Date Due  . Flu Shot  08/24/2016  . Colon Cancer Screening  03/03/2018  . Tetanus Vaccine  08/03/2022  . Pneumonia vaccines  Completed   Preventive Care for Adults  A healthy lifestyle and preventive care can promote health and wellness. Preventive health guidelines for adults include the following key practices.  . A routine yearly physical is a good way to check with your health care provider about your health and preventive screening. It is a chance to share any concerns and updates on your health and to receive a thorough exam.  . Visit your dentist for a routine exam and preventive care every 6 months. Brush your teeth twice a day and floss once a day. Good oral hygiene prevents tooth decay and gum disease.  . The frequency of eye exams is based on your age, health, family medical history, use  of contact lenses, and other factors. Follow your health care provider's ecommendations for frequency of eye exams.  . Eat a healthy diet. Foods like vegetables, fruits, whole grains, low-fat dairy products, and lean protein foods contain the nutrients you need without too many calories. Decrease your intake of foods high in solid fats, added sugars, and salt. Eat the right amount of calories for you. Get information about a proper diet from your health care provider, if necessary.  . Regular physical exercise is one of the most important things you can do for your health. Most  adults should get at least 150 minutes of moderate-intensity exercise (any activity that increases your heart rate and causes you to sweat) each week. In addition, most adults need muscle-strengthening exercises on 2 or more days a week.  Silver Sneakers may be a benefit available to you. To determine eligibility, you may visit the website: www.silversneakers.com or contact program at 4194789776 Mon-Fri between 8AM-8PM.   . Maintain a healthy weight. The body mass index (BMI) is a screening tool to identify possible weight problems. It provides an estimate of body fat based on height and weight. Your health care provider can find your BMI and can help you achieve or maintain a healthy weight.   For adults 20 years and older: ? A BMI below 18.5 is considered underweight. ? A BMI of 18.5 to 24.9 is normal. ? A BMI of 25 to 29.9 is considered overweight. ? A BMI of 30 and above is considered obese.   . Maintain normal blood lipids and cholesterol levels by exercising and minimizing your intake of saturated fat. Eat a balanced diet with plenty of fruit and vegetables. Blood tests for lipids and cholesterol should begin at age 54 and be repeated every 5 years. If your lipid or cholesterol levels are high, you are over 50, or you are at high risk for heart disease, you may need your cholesterol levels checked more frequently. Ongoing high lipid and  cholesterol levels should be treated with medicines if diet and exercise are not working.  . If you smoke, find out from your health care provider how to quit. If you do not use tobacco, please do not start.  . If you choose to drink alcohol, please do not consume more than 2 drinks per day. One drink is considered to be 12 ounces (355 mL) of beer, 5 ounces (148 mL) of wine, or 1.5 ounces (44 mL) of liquor.  . If you are 26-36 years old, ask your health care provider if you should take aspirin to prevent strokes.  . Use sunscreen. Apply sunscreen  liberally and repeatedly throughout the day. You should seek shade when your shadow is shorter than you. Protect yourself by wearing long sleeves, pants, a wide-brimmed hat, and sunglasses year round, whenever you are outdoors.  . Once a month, do a whole body skin exam, using a mirror to look at the skin on your back. Tell your health care provider of new moles, moles that have irregular borders, moles that are larger than a pencil eraser, or moles that have changed in shape or color.

## 2016-08-15 NOTE — Progress Notes (Signed)
Medical screening examination/treatment/procedure(s) were performed by registered nurse and as supervising non-physician practitioner, I was immediately available for consultation/collaboration.   Webb Silversmith, NP

## 2016-08-31 ENCOUNTER — Encounter: Payer: Self-pay | Admitting: Family Medicine

## 2016-08-31 ENCOUNTER — Other Ambulatory Visit: Payer: Medicare Other

## 2016-08-31 ENCOUNTER — Ambulatory Visit (INDEPENDENT_AMBULATORY_CARE_PROVIDER_SITE_OTHER): Payer: Medicare Other | Admitting: Family Medicine

## 2016-08-31 VITALS — BP 130/70 | HR 73 | Temp 98.2°F | Ht 70.0 in | Wt 186.0 lb

## 2016-08-31 DIAGNOSIS — Z0000 Encounter for general adult medical examination without abnormal findings: Secondary | ICD-10-CM | POA: Diagnosis not present

## 2016-08-31 NOTE — Progress Notes (Signed)
 Dr.  T. , MD, [facility] [address] [place], [address] Phone: [phone] Fax: [phone]  08/31/2016  Patient: Jeffery [name], MRN: [id number], DOB: 08/15/1940, 75 y.o.  Primary Physician:  , , MD   Chief Complaint  Patient presents with  . Annual Exam    Part 2   Subjective:   Jeffery [name] is a 75 y.o. pleasant patient who presents with the following:  Preventative Health Maintenance Visit:  Health Maintenance Summary Reviewed and updated, unless pt declines services.  Tobacco History Reviewed. Alcohol: No concerns, no excessive use Exercise Habits: Some activity, rec at least 30 mins 5 times a week STD concerns: no risk or activity to increase risk Drug Use: None Encouraged self-testicular check  He is having some fatigue. Occasional insomnia.  Health Maintenance  Topic Date Due  . INFLUENZA VACCINE  08/24/2016  . COLONOSCOPY  03/03/2018  . TETANUS/TDAP  08/03/2022  . PNA vac Low Risk Adult  Completed   Immunization History  Administered Date(s) Administered  . Influenza Split 11/10/2010, 10/03/2011  . Influenza Whole 12/29/2008  . Influenza,inj,Quad PF,36+ Mos 11/20/2012, 12/25/2013, 11/27/2014, 10/20/2015  . Influenza-Unspecified 11/07/2013  . Pneumococcal Conjugate-13 08/07/2013  . Pneumococcal Polysaccharide-23 08/07/2008  . Tdap 08/02/2012  . Zoster 08/07/2008   Patient Active Problem List   Diagnosis Date Noted  . 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 OTHER SPECIFIED FORMS OF HEARING LOSS 08/10/2009  . HYPERCHOLESTEROLEMIA  IIA 11/25/2008  . BENIGN PROSTATIC HYPERTROPHY, HX OF, S/P TURP 08/07/2008  . INTERNAL HEMORRHOIDS 05/14/2008  . ESOPHAGEAL MOTILITY DISORDER 05/14/2008  . ANAL FISTULA 05/14/2008  . COLONIC POLYPS, HYPERPLASTIC, HX OF 05/14/2008  . ESOPHAGITIS, HX OF 05/14/2008  . HYPERTENSION, BENIGN 05/06/2008  . CAD 05/06/2008  . Crohn's disease  (HCC) 05/06/2008   Past Medical History:  Diagnosis Date  . Anal fistula   . CAD (coronary artery disease)   . Chest pain    in past/? heartburn/EKG normal  . Crohn disease (HCC)   . Esophageal motility disorder   . GERD (gastroesophageal reflux disease)   . Hearing loss   . History of BPH   . History of esophagitis   . Hyperlipidemia   . Hyperplastic colon polyp   . Hypertension   . Internal hemorrhoids   . Right inguinal hernia    Past Surgical History:  Procedure Laterality Date  . EYE SURGERY     right eye  . HERNIA REPAIR     x 2  . LIPOMA EXCISION     chest  . PROSTATE SURGERY     x 2, TURP   Social History   Social History  . Marital status: Married    Spouse name: N/A  . Number of children: 2  . Years of education: N/A   Occupational History  . Retired-safety consulting Retired   Social History Main Topics  . Smoking status: Former Smoker    Packs/day: 1.00    Years: 20.00    Types: Cigarettes    Quit date: 01/24/1978  . Smokeless tobacco: Never Used  . Alcohol use 0.6 - 2.4 oz/week    1 Standard drinks or equivalent per week     Comment: occasional wine or beer weekly  . Drug use: No  . Sexual activity: No   Other Topics Concern  . Not on file   Social History Narrative   Daily caffeine   Family History  Problem Relation   Age of Onset  . Colon polyps Mother   . Heart attack Father 66  . Colon cancer Neg Hx   . Esophageal cancer Neg Hx   . Rectal cancer Neg Hx   . Stomach cancer Neg Hx    Allergies  Allergen Reactions  . Ace Inhibitors Other (See Comments)    REACTION: cough    Medication list has been reviewed and updated.   General: Denies fever, chills, sweats. No significant weight loss. Eyes: Denies blurring,significant itching ENT: Denies earache, sore throat, and hoarseness. Cardiovascular: Denies chest pains, palpitations, dyspnea on exertion Respiratory: Denies cough, dyspnea at rest,wheeezing Breast: no concerns about  lumps GI: Denies nausea, vomiting, diarrhea, constipation, change in bowel habits, abdominal pain, melena, hematochezia GU: Denies penile discharge, ED, urinary flow / outflow problems. No STD concerns. Musculoskeletal: Denies back pain, joint pain Derm: Denies rash, itching Neuro: Denies  paresthesias, frequent falls, frequent headaches Psych: Denies depression, anxiety Endocrine: Denies cold intolerance, heat intolerance, polydipsia Heme: Denies enlarged lymph nodes Allergy: No hayfever  Objective:   BP 130/70   Pulse 73   Temp 98.2 F (36.8 C) (Oral)   Ht 5' 10" (1.778 m)   Wt 186 lb (84.4 kg)   BMI 26.69 kg/m  Ideal Body Weight: Weight in (lb) to have BMI = 25: 173.9  No exam data present  GEN: well developed, well nourished, no acute distress Eyes: conjunctiva and lids normal, PERRLA, EOMI ENT: TM clear, nares clear, oral exam WNL Neck: supple, no lymphadenopathy, no thyromegaly, no JVD Pulm: clear to auscultation and percussion, respiratory effort normal CV: regular rate and rhythm, S1-S2, no murmur, rub or gallop, no bruits, peripheral pulses normal and symmetric, no cyanosis, clubbing, edema or varicosities GI: soft, non-tender; no hepatosplenomegaly, masses; active bowel sounds all quadrants GU: no hernia, testicular mass, penile discharge Lymph: no cervical, axillary or inguinal adenopathy MSK: gait normal, muscle tone and strength WNL, no joint swelling, effusions, discoloration, crepitus  SKIN: clear, good turgor, color WNL, no rashes, lesions, or ulcerations Neuro: normal mental status, normal strength, sensation, and motion Psych: alert; oriented to person, place and time, normally interactive and not anxious or depressed in appearance. All labs reviewed with patient.  Lipids:    Component Value Date/Time   CHOL 166 08/15/2016 1058   TRIG 90.0 08/15/2016 1058   HDL 52.80 08/15/2016 1058   LDLDIRECT 160.7 06/16/2008 0913   VLDL 18.0 08/15/2016 1058    CHOLHDL 3 08/15/2016 1058   CBC: CBC Latest Ref Rng & Units 08/15/2016 02/15/2016 08/10/2015  WBC 4.0 - 10.5 K/uL 5.2 6.1 4.9  Hemoglobin 13.0 - 17.0 g/dL 14.5 13.8 13.6  Hematocrit 39.0 - 52.0 % 43.9 41.1 40.8  Platelets 150.0 - 400.0 K/uL 236.0 222.0 161.0    Basic Metabolic Panel:    Component Value Date/Time   NA 138 08/15/2016 1058   K 4.6 08/15/2016 1058   CL 101 08/15/2016 1058   CO2 30 08/15/2016 1058   BUN 13 08/15/2016 1058   CREATININE 0.91 08/15/2016 1058   GLUCOSE 104 (H) 08/15/2016 1058   CALCIUM 9.9 08/15/2016 1058   Hepatic Function Latest Ref Rng & Units 08/15/2016 02/15/2016 08/10/2015  Total Protein 6.0 - 8.3 g/dL 6.6 7.0 6.7  Albumin 3.5 - 5.2 g/dL 4.5 4.3 4.3  AST 0 - 37 U/L _0 ALT 0 - 53 U/L _1 Alk Phosphatase 39 - 117 U/L 54 51 50  Total Bilirubin 0.2 - 1.2 mg/dL 1.3(H)  1.0 1.3(H)  Bilirubin, Direct 0.0 - 0.3 mg/dL 0.2 0.3 0.2    Lab Results  Component Value Date   TSH 3.33 08/02/2013   Lab Results  Component Value Date   PSA 1.23 08/15/2016   PSA 1.09 08/10/2015   PSA 1.23 08/18/2014    Assessment and Plan:   Healthcare maintenance  Health Maintenance Exam: The patient's preventative maintenance and recommended screening tests for an annual wellness exam were reviewed in full today. Brought up to date unless services declined.  Counselled on the importance of diet, exercise, and its role in overall health and mortality. The patient's FH and SH was reviewed, including their home life, tobacco status, and drug and alcohol status.  Follow-up in 1 year for physical exam or additional follow-up below.  Follow-up: No Follow-up on file. Or follow-up in 1 year if not noted.  Signed,   T. , MD   Allergies as of 08/31/2016      Reactions   Ace Inhibitors Other (See Comments)   REACTION: cough      Medication List       Accurate as of 08/31/16 11:07 AM. Always use your most recent med list.          amLODipine  5 MG tablet Commonly known as:  NORVASC TAKE 1 TABLET BY MOUTH  EVERY DAY   aspirin 81 MG tablet Take 81 mg by mouth daily.   atorvastatin 20 MG tablet Commonly known as:  LIPITOR TAKE 1 TABLET BY MOUTH  DAILY   CENTRUM PO Take 1 tablet by mouth daily.   folic acid 1 MG tablet Commonly known as:  FOLVITE Take 1 tablet (1 mg total) by mouth daily.   hydrochlorothiazide 12.5 MG capsule Commonly known as:  MICROZIDE TAKE 1 CAPSULE BY MOUTH  DAILY   latanoprost 0.005 % ophthalmic solution Commonly known as:  XALATAN Place 1 drop into the right eye at bedtime.   losartan 100 MG tablet Commonly known as:  COZAAR TAKE 1 TABLET BY MOUTH  DAILY   sulfaSALAzine 500 MG tablet Commonly known as:  AZULFIDINE Take 2 tablets (1,000 mg total) by mouth 2 (two) times daily.       

## 2016-09-29 NOTE — Telephone Encounter (Signed)
completed 08/15/16

## 2016-10-05 ENCOUNTER — Other Ambulatory Visit: Payer: Self-pay | Admitting: Family Medicine

## 2016-10-05 MED ORDER — HYDROCHLOROTHIAZIDE 12.5 MG PO CAPS: 12.5000 mg | ORAL_CAPSULE | Freq: Every day | ORAL | 0 refills | Status: DC

## 2016-10-16 ENCOUNTER — Other Ambulatory Visit: Payer: Self-pay | Admitting: Family Medicine

## 2016-10-17 MED ORDER — LOSARTAN POTASSIUM 100 MG PO TABS: 100.0000 mg | ORAL_TABLET | Freq: Every day | ORAL | 3 refills | Status: DC

## 2016-11-02 DIAGNOSIS — Z961 Presence of intraocular lens: Secondary | ICD-10-CM | POA: Diagnosis not present

## 2016-11-02 DIAGNOSIS — H2512 Age-related nuclear cataract, left eye: Secondary | ICD-10-CM | POA: Diagnosis not present

## 2016-11-02 DIAGNOSIS — H401130 Primary open-angle glaucoma, bilateral, stage unspecified: Secondary | ICD-10-CM | POA: Diagnosis not present

## 2016-11-03 ENCOUNTER — Encounter: Payer: Self-pay | Admitting: Family Medicine

## 2016-11-03 ENCOUNTER — Ambulatory Visit (INDEPENDENT_AMBULATORY_CARE_PROVIDER_SITE_OTHER): Payer: Medicare Other | Admitting: Family Medicine

## 2016-11-03 VITALS — BP 140/74 | HR 70 | Temp 98.2°F | Ht 70.0 in | Wt 186.2 lb

## 2016-11-03 DIAGNOSIS — N410 Acute prostatitis: Secondary | ICD-10-CM

## 2016-11-03 DIAGNOSIS — Z23 Encounter for immunization: Secondary | ICD-10-CM

## 2016-11-03 DIAGNOSIS — R300 Dysuria: Secondary | ICD-10-CM

## 2016-11-03 LAB — POC URINALSYSI DIPSTICK (AUTOMATED)
Bilirubin, UA: NEGATIVE
Blood, UA: NEGATIVE
Glucose, UA: NEGATIVE
KETONES UA: NEGATIVE
Leukocytes, UA: NEGATIVE
Nitrite, UA: NEGATIVE
Protein, UA: NEGATIVE
SPEC GRAV UA: 1.025 (ref 1.010–1.025)
Urobilinogen, UA: 0.2 E.U./dL
pH, UA: 6 (ref 5.0–8.0)

## 2016-11-03 MED ORDER — SULFAMETHOXAZOLE-TRIMETHOPRIM 800-160 MG PO TABS: 1.0000 | ORAL_TABLET | Freq: Two times a day (BID) | ORAL | 1 refills | Status: DC

## 2016-11-03 NOTE — Progress Notes (Signed)
Dr. Frederico Hamman T. Dyer Klug, MD, [facility] [place], [address] Phone: [phone] Fax: [phone]  11/03/2016  Patient: Jeffery [name], MRN: [id number], DOB: May 05, 1940, 76 y.o.  Primary Physician:  Owens Loffler, MD   Chief Complaint  Patient presents with  . Hematuria  . Burning with Urination   Subjective:   Jeffery [name] is a 76 y.o. very pleasant male patient who presents with the following:  2-3 weeks, burning if urinating. A little bit of blood. Crotch and underneath.  He is more having pain in the perineal area. He has not had any sexual intercourse outside of his marriage. No concern about STDs at all and no penile dripping. He has had prostatitis previously, in the distant past.  Past Medical History, Surgical History, Social History, Family History, Problem List, Medications, and Allergies have been reviewed and updated if relevant.  Patient Active Problem List   Diagnosis Date Noted  . 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 OTHER SPECIFIED FORMS OF HEARING LOSS 08/10/2009  . HYPERCHOLESTEROLEMIA  IIA 11/25/2008  . BENIGN PROSTATIC HYPERTROPHY, HX OF, S/P TURP 08/07/2008  . INTERNAL HEMORRHOIDS 05/14/2008  . ESOPHAGEAL MOTILITY DISORDER 05/14/2008  . ANAL FISTULA 05/14/2008  . COLONIC POLYPS, HYPERPLASTIC, HX OF 05/14/2008  . ESOPHAGITIS, HX OF 05/14/2008  . HYPERTENSION, BENIGN 05/06/2008  . CAD 05/06/2008  . Crohn's disease ([place] [facility]) 05/06/2008    Past Medical History:  Diagnosis Date  . Anal fistula   . CAD (coronary artery disease)   . Chest pain    in past/? heartburn/EKG normal  . Crohn disease ([facility])   . Esophageal motility disorder   . GERD (gastroesophageal reflux disease)   . Hearing loss   . History of BPH   . History of esophagitis   . Hyperlipidemia   . Hyperplastic colon polyp   . Hypertension   . Internal hemorrhoids   . Right inguinal hernia     Past Surgical  History:  Procedure Laterality Date  . EYE SURGERY     right eye  . HERNIA REPAIR     x 2  . LIPOMA EXCISION     chest  . PROSTATE SURGERY     x 2, TURP    Social History   Social History  . Marital status: Married    Spouse name: N/A  . Number of children: 2  . Years of education: N/A   Occupational History  . Retired-safety consulting Retired   Social History Main Topics  . Smoking status: Former Smoker    Packs/day: 1.00    Years: 20.00    Types: Cigarettes    Quit date: 01/24/1978  . Smokeless tobacco: Never Used  . Alcohol use 0.6 - 2.4 oz/week    1 Standard drinks or equivalent per week     Comment: occasional wine or beer weekly  . Drug use: No  . Sexual activity: No   Other Topics Concern  . Not on file   Social History Narrative   Daily caffeine    Family History  Problem Relation Age of Onset  . Colon polyps Mother   . Heart attack Father 70  . Colon cancer Neg Hx   . Esophageal cancer Neg Hx   . Rectal cancer Neg Hx   . Stomach cancer Neg Hx     Allergies  Allergen Reactions  . Ace Inhibitors Other (See Comments)    REACTION: cough  Medication list reviewed and updated in full in [place].   GEN: No acute illnesses, no fevers, chills. GI: No n/v/d, eating normally Pulm: No SOB Interactive and getting along well at home.  Otherwise, ROS is as per the HPI.  Objective:   BP 140/74   Pulse 70   Temp 98.2 F (36.8 C) (Oral)   Ht 5' 10"  (1.778 m)   Wt 186 lb 4 oz (84.5 kg)   BMI 26.72 kg/m   GEN: WDWN, NAD, Non-toxic, A & O x 3 HEENT: Atraumatic, Normocephalic. Neck supple. No masses, No LAD. Ears and Nose: No external deformity. CV: RRR, No M/G/R. No JVD. No thrill. No extra heart sounds. PULM: CTA B, no wheezes, crackles, rhonchi. No retractions. No resp. distress. No accessory muscle use. EXTR: No c/c/e NEURO Normal gait.  PSYCH: Normally interactive. Conversant. Not depressed or anxious appearing.  Calm demeanor.     Laboratory and Imaging Data:  Assessment and Plan:   Prostatitis, acute  Burning with urination - Plan: POCT Urinalysis Dipstick (Automated)  Need for prophylactic vaccination and inoculation against influenza - Plan: Flu Vaccine QUAD 36+ mos IM  6 weeks ABX, call if sx return  Follow-up: No Follow-up on file.  Meds ordered this encounter  Medications  . sulfamethoxazole-trimethoprim (BACTRIM DS,SEPTRA DS) 800-160 MG tablet    Sig: Take 1 tablet by mouth 2 (two) times daily.    Dispense:  60 tablet    Refill:  1   There are no discontinued medications. Orders Placed This Encounter  Procedures  . Flu Vaccine QUAD 36+ mos IM  . POCT Urinalysis Dipstick (Automated)    Signed,  Macy Polio T. Kamden Stanislaw, MD   Allergies as of 11/03/2016      Reactions   Ace Inhibitors Other (See Comments)   REACTION: cough      Medication List       Accurate as of 11/03/16  2:23 PM. Always use your most recent med list.          amLODipine 5 MG tablet Commonly known as:  NORVASC TAKE 1 TABLET BY MOUTH  EVERY DAY   aspirin 81 MG tablet Take 81 mg by mouth daily.   atorvastatin 20 MG tablet Commonly known as:  LIPITOR TAKE 1 TABLET BY MOUTH  DAILY   CENTRUM PO Take 1 tablet by mouth daily.   folic acid 1 MG tablet Commonly known as:  FOLVITE Take 1 tablet (1 mg total) by mouth daily.   hydrochlorothiazide 12.5 MG capsule Commonly known as:  MICROZIDE Take 1 capsule (12.5 mg total) by mouth daily.   latanoprost 0.005 % ophthalmic solution Commonly known as:  XALATAN Place 1 drop into the right eye at bedtime.   losartan 100 MG tablet Commonly known as:  COZAAR Take 1 tablet (100 mg total) by mouth daily.   sulfamethoxazole-trimethoprim 800-160 MG tablet Commonly known as:  BACTRIM DS,SEPTRA DS Take 1 tablet by mouth 2 (two) times daily.   sulfaSALAzine 500 MG tablet Commonly known as:  AZULFIDINE Take 2 tablets (1,000 mg total) by mouth 2 (two) times daily.

## 2016-11-15 ENCOUNTER — Ambulatory Visit: Payer: Medicare Other

## 2016-11-17 ENCOUNTER — Other Ambulatory Visit: Payer: Self-pay | Admitting: Family Medicine

## 2017-01-03 ENCOUNTER — Other Ambulatory Visit: Payer: Self-pay | Admitting: Family Medicine

## 2017-01-04 ENCOUNTER — Other Ambulatory Visit: Payer: Self-pay | Admitting: Family Medicine

## 2017-02-06 ENCOUNTER — Ambulatory Visit: Payer: Medicare Other | Admitting: Family Medicine

## 2017-02-06 ENCOUNTER — Encounter: Payer: Self-pay | Admitting: Family Medicine

## 2017-02-06 ENCOUNTER — Other Ambulatory Visit: Payer: Self-pay

## 2017-02-06 VITALS — BP 110/70 | HR 89 | Temp 98.4°F | Ht 70.0 in | Wt 184.8 lb

## 2017-02-06 DIAGNOSIS — R300 Dysuria: Secondary | ICD-10-CM | POA: Diagnosis not present

## 2017-02-06 DIAGNOSIS — N410 Acute prostatitis: Secondary | ICD-10-CM | POA: Diagnosis not present

## 2017-02-06 LAB — POC URINALSYSI DIPSTICK (AUTOMATED)
Bilirubin, UA: NEGATIVE
Glucose, UA: NEGATIVE
Ketones, UA: NEGATIVE
Leukocytes, UA: NEGATIVE
NITRITE UA: NEGATIVE
PROTEIN UA: NEGATIVE
RBC UA: NEGATIVE
SPEC GRAV UA: 1.02 (ref 1.010–1.025)
UROBILINOGEN UA: 0.2 U/dL
pH, UA: 6 (ref 5.0–8.0)

## 2017-02-06 MED ORDER — CIPROFLOXACIN HCL 500 MG PO TABS: 500.0000 mg | ORAL_TABLET | Freq: Two times a day (BID) | ORAL | 1 refills | Status: DC

## 2017-02-06 NOTE — Progress Notes (Signed)
Dr. Frederico Hamman T. Marijah Larranaga, MD, [facility] [place], [address] Phone: [phone] Fax: [phone]  02/06/2017  Patient: Jeffery [name], MRN: [id number], DOB: 1940/11/06, 77 y.o.  Primary Physician:  Owens Loffler, MD   Chief Complaint  Patient presents with  . Burning with Urination  . Pre-visit Screening Tool Documentation   Subjective:   Jeffery [name] is a 77 y.o. very pleasant male patient who presents with the following:  6 weeks of sulfa abx.   Very nice gentleman who I saw in October and at that point I treated him for prostatitis with 6 weeks of Bactrim.  His symptoms resolved, but over the last couple of weeks, he has started to develop some pain in the perineal region, and he also has had some burning with urination without any blood.  Past Medical History, Surgical History, Social History, Family History, Problem List, Medications, and Allergies have been reviewed and updated if relevant.  Patient Active Problem List   Diagnosis Date Noted  . Age-related nuclear cataract of left eye 05/03/2016  . Primary open angle glaucoma of left eye, mild stage 05/03/2016  . Primary open angle glaucoma of right eye, moderate stage 05/03/2016  . 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 OTHER SPECIFIED FORMS OF HEARING LOSS 08/10/2009  . HYPERCHOLESTEROLEMIA  IIA 11/25/2008  . BENIGN PROSTATIC HYPERTROPHY, HX OF, S/P TURP 08/07/2008  . INTERNAL HEMORRHOIDS 05/14/2008  . ESOPHAGEAL MOTILITY DISORDER 05/14/2008  . ANAL FISTULA 05/14/2008  . COLONIC POLYPS, HYPERPLASTIC, HX OF 05/14/2008  . ESOPHAGITIS, HX OF 05/14/2008  . HYPERTENSION, BENIGN 05/06/2008  . CAD 05/06/2008  . Crohn's disease ([place]) 05/06/2008    Past Medical History:  Diagnosis Date  . Anal fistula   . CAD (coronary artery disease)   . Chest pain    in past/? heartburn/EKG normal  . Crohn disease ([facility])   . Esophageal motility disorder   .  GERD (gastroesophageal reflux disease)   . Hearing loss   . History of BPH   . History of esophagitis   . Hyperlipidemia   . Hyperplastic colon polyp   . Hypertension   . Internal hemorrhoids   . Right inguinal hernia     Past Surgical History:  Procedure Laterality Date  . EYE SURGERY     right eye  . HERNIA REPAIR     x 2  . LIPOMA EXCISION     chest  . PROSTATE SURGERY     x 2, TURP    Social History   Socioeconomic History  . Marital status: Married    Spouse name: Not on file  . Number of children: 2  . Years of education: Not on file  . Highest education level: Not on file  Social Needs  . Financial resource strain: Not on file  . Food insecurity - worry: Not on file  . Food insecurity - inability: Not on file  . Transportation needs - medical: Not on file  . Transportation needs - non-medical: Not on file  Occupational History  . Occupation: Restaurant manager, fast food: RETIRED  Tobacco Use  . Smoking status: Former Smoker    Packs/day: 1.00    Years: 20.00    Pack years: 20.00    Types: Cigarettes    Last attempt to quit: 01/24/1978    Years since quitting: 39.0  . Smokeless tobacco: Never Used  Substance and Sexual Activity  . Alcohol  use: Yes    Alcohol/week: 0.6 - 2.4 oz    Types: 1 Standard drinks or equivalent per week    Comment: occasional wine or beer weekly  . Drug use: No  . Sexual activity: No  Other Topics Concern  . Not on file  Social History Narrative   Daily caffeine    Family History  Problem Relation Age of Onset  . Colon polyps Mother   . Heart attack Father 90  . Colon cancer Neg Hx   . Esophageal cancer Neg Hx   . Rectal cancer Neg Hx   . Stomach cancer Neg Hx     Allergies  Allergen Reactions  . Ace Inhibitors Other (See Comments)    REACTION: cough    Medication list reviewed and updated in full in [place].   GEN: No acute illnesses, no fevers, chills. GI: No n/v/d, eating normally Pulm:  No SOB Interactive and getting along well at home.  Otherwise, ROS is as per the HPI.  Objective:   BP 110/70   Pulse 89   Temp 98.4 F (36.9 C) (Oral)   Ht 5' 10"  (1.778 m)   Wt 184 lb 12 oz (83.8 kg)   BMI 26.51 kg/m   GEN: WDWN, NAD, Non-toxic, A & O x 3 HEENT: Atraumatic, Normocephalic. Neck supple. No masses, No LAD. Ears and Nose: No external deformity. EXTR: No c/c/e NEURO Normal gait.  PSYCH: Normally interactive. Conversant. Not depressed or anxious appearing.  Calm demeanor.   Laboratory and Imaging Data:  Assessment and Plan:   Prostatitis, acute  Burning with urination - Plan: POCT Urinalysis Dipstick (Automated)  8 weeks cipro 500 mg for recurrence   Follow-up: No Follow-up on file.  Meds ordered this encounter  Medications  . ciprofloxacin (CIPRO) 500 MG tablet    Sig: Take 1 tablet (500 mg total) by mouth 2 (two) times daily.    Dispense:  60 tablet    Refill:  1   Medications Discontinued During This Encounter  Medication Reason  . sulfamethoxazole-trimethoprim (BACTRIM DS,SEPTRA DS) 800-160 MG tablet Completed Course   Orders Placed This Encounter  Procedures  . POCT Urinalysis Dipstick (Automated)    Signed,  Geraldine Sandberg T. Quantarius Genrich, MD   Allergies as of 02/06/2017      Reactions   Ace Inhibitors Other (See Comments)   REACTION: cough      Medication List        Accurate as of 02/06/17  2:32 PM. Always use your most recent med list.          amLODipine 5 MG tablet Commonly known as:  NORVASC TAKE 1 TABLET BY MOUTH  EVERY DAY   aspirin 81 MG tablet Take 81 mg by mouth daily.   atorvastatin 20 MG tablet Commonly known as:  LIPITOR TAKE 1 TABLET BY MOUTH  DAILY   CENTRUM PO Take 1 tablet by mouth daily.   ciprofloxacin 500 MG tablet Commonly known as:  CIPRO Take 1 tablet (500 mg total) by mouth 2 (two) times daily.   folic acid 1 MG tablet Commonly known as:  FOLVITE Take 1 tablet (1 mg total) by mouth daily.     hydrochlorothiazide 12.5 MG capsule Commonly known as:  MICROZIDE TAKE 1 CAPSULE BY MOUTH  DAILY   latanoprost 0.005 % ophthalmic solution Commonly known as:  XALATAN Place 1 drop into the right eye at bedtime.   losartan 100 MG tablet Commonly known as:  COZAAR Take 1 tablet (  100 mg total) by mouth daily.   sulfaSALAzine 500 MG tablet Commonly known as:  AZULFIDINE Take 2 tablets (1,000 mg total) by mouth 2 (two) times daily.

## 2017-02-22 ENCOUNTER — Other Ambulatory Visit: Payer: Self-pay | Admitting: Family Medicine

## 2017-03-08 DIAGNOSIS — H401130 Primary open-angle glaucoma, bilateral, stage unspecified: Secondary | ICD-10-CM | POA: Diagnosis not present

## 2017-03-08 DIAGNOSIS — Z961 Presence of intraocular lens: Secondary | ICD-10-CM | POA: Diagnosis not present

## 2017-03-08 DIAGNOSIS — H2512 Age-related nuclear cataract, left eye: Secondary | ICD-10-CM | POA: Diagnosis not present

## 2017-03-09 ENCOUNTER — Other Ambulatory Visit: Payer: Self-pay | Admitting: Internal Medicine

## 2017-04-24 ENCOUNTER — Ambulatory Visit: Payer: Medicare Other | Admitting: Internal Medicine

## 2017-04-24 ENCOUNTER — Encounter: Payer: Self-pay | Admitting: Internal Medicine

## 2017-04-24 VITALS — BP 118/64 | HR 72 | Ht 70.0 in | Wt 183.2 lb

## 2017-04-24 DIAGNOSIS — K5010 Crohn's disease of large intestine without complications: Secondary | ICD-10-CM

## 2017-04-24 MED ORDER — SULFASALAZINE 500 MG PO TABS: 1000.0000 mg | ORAL_TABLET | Freq: Two times a day (BID) | ORAL | 3 refills | Status: DC

## 2017-04-24 MED ORDER — FOLIC ACID 1 MG PO TABS: 1.0000 mg | ORAL_TABLET | Freq: Every day | ORAL | 1 refills | Status: DC

## 2017-04-24 NOTE — Patient Instructions (Addendum)
We have sent the following medications to your pharmacy for you to pick up at your convenience: Sulfasalazine 1 gram twice daily Folic Acid  Please follow up with Dr Hilarie Fredrickson in February 2020.  If you are age 77 or older, your body mass index should be between 23-30. Your Body mass index is 26.29 kg/m. If this is out of the aforementioned range listed, please consider follow up with your Primary Care Provider.  If you are age 3 or younger, your body mass index should be between 19-25. Your Body mass index is 26.29 kg/m. If this is out of the aformentioned range listed, please consider follow up with your Primary Care Provider.

## 2017-04-24 NOTE — Progress Notes (Signed)
Subjective:    Patient ID: OSMEL DYKSTRA, male    DOB: March 28, 1940, 77 y.o.   MRN: [id number]  HPI Johnathen Testa is a 77 year old male with a history of Crohn's colitis, primarily left-sided and diagnosed in 1986, GERD, hypertension and hyperlipidemia who is here for follow-up.  He was last seen in February 2018 and he is here alone today.  He has been maintained on sulfasalazine 1 g twice daily.  He reports he has been doing very well from a colitis standpoint.  He is having regular bowel movements which for him is 1 formed stool per day.  No blood in his stool or melena.  He does occasionally have left lower quadrant abdominal discomfort which does not last very long it has been present on and off for years.  This is not changed in frequency or intensity.  Good appetite without upper GI or hepatobiliary complaint.  He is also taking folate 1 mg daily with his sulfasalazine  His last colonoscopy was on 03/04/2015 which showed endoscopic and histologic remission of his IBD.  2 polyps were removed which were not adenomatous.  Review of Systems As per HPI, otherwise negative  Current Medications, Allergies, Past Medical History, Past Surgical History, Family History and Social History were reviewed in [employer] record.     Objective:   Physical Exam BP 118/64   Pulse 72   Ht 5' 10"  (1.778 m)   Wt 183 lb 4 oz (83.1 kg)   BMI 26.29 kg/m  Constitutional: Well-developed and well-nourished. No distress. HEENT: Normocephalic and atraumatic.  Conjunctivae are normal.  No scleral icterus. Neck: Neck supple. Trachea midline. Cardiovascular: Normal rate, regular rhythm and intact distal pulses. No M/R/G Pulmonary/chest: Effort normal and breath sounds normal. No wheezing, rales or rhonchi. Abdominal: Soft, nontender, nondistended. Bowel sounds active throughout.  Extremities: no clubbing, cyanosis, or edema Neurological: Alert and oriented to person place and time. Skin:  Skin is warm and dry. Psychiatric: Normal mood and affect. Behavior is normal.  CBC    Component Value Date/Time   WBC 5.2 08/15/2016 1058   RBC 4.72 08/15/2016 1058   HGB 14.5 08/15/2016 1058   HCT 43.9 08/15/2016 1058   PLT 236.0 08/15/2016 1058   MCV 93.0 08/15/2016 1058   MCHC 33.0 08/15/2016 1058   RDW 13.4 08/15/2016 1058   LYMPHSABS 1.4 08/15/2016 1058   MONOABS 0.7 08/15/2016 1058   EOSABS 0.2 08/15/2016 1058   BASOSABS 0.0 08/15/2016 1058   CMP     Component Value Date/Time   NA 138 08/15/2016 1058   K 4.6 08/15/2016 1058   CL 101 08/15/2016 1058   CO2 30 08/15/2016 1058   GLUCOSE 104 (H) 08/15/2016 1058   BUN 13 08/15/2016 1058   CREATININE 0.91 08/15/2016 1058   CALCIUM 9.9 08/15/2016 1058   PROT 6.6 08/15/2016 1058   ALBUMIN 4.5 08/15/2016 1058   AST 23 08/15/2016 1058   ALT 23 08/15/2016 1058   ALKPHOS 54 08/15/2016 1058   BILITOT 1.3 (H) 08/15/2016 1058   GFRNONAA 82.62 08/18/2009 0851   GFRAA 109 09/17/2007 0845        Assessment & Plan:  77 year old male with a history of Crohn's colitis, primarily left-sided and diagnosed in 1986, GERD, hypertension and hyperlipidemia who is here for follow-up.  1.  Crohn's left-sided colitis --he has done very well over the last several years and remains in clinical remission.  He had histologic remission at the time of his  last colonoscopy 2 years ago.  We discussed the risk versus benefits of continuing versus discontinuing sulfasalazine therapy.  Without this medicine I would be concerned about recurrent colitis.  He is in agreement --Continue sulfasalazine 1 g twice daily with supplemental folate 1 mg daily --Consider surveillance colonoscopy next year at the 3-year mark which would be around February 2020 --Annual followup  15 minutes spent with the patient today. Greater than 50% was spent in counseling and coordination of care with the patient

## 2017-05-03 ENCOUNTER — Ambulatory Visit: Payer: Medicare Other | Admitting: Cardiovascular Disease

## 2017-05-08 ENCOUNTER — Ambulatory Visit: Payer: Medicare Other | Admitting: Cardiovascular Disease

## 2017-05-08 ENCOUNTER — Encounter: Payer: Self-pay | Admitting: Cardiovascular Disease

## 2017-05-08 VITALS — BP 128/70 | HR 74 | Ht 70.0 in | Wt 185.0 lb

## 2017-05-08 DIAGNOSIS — R0609 Other forms of dyspnea: Secondary | ICD-10-CM | POA: Diagnosis not present

## 2017-05-08 DIAGNOSIS — I10 Essential (primary) hypertension: Secondary | ICD-10-CM

## 2017-05-08 DIAGNOSIS — E7800 Pure hypercholesterolemia, unspecified: Secondary | ICD-10-CM | POA: Diagnosis not present

## 2017-05-08 DIAGNOSIS — I2510 Atherosclerotic heart disease of native coronary artery without angina pectoris: Secondary | ICD-10-CM | POA: Diagnosis not present

## 2017-05-08 NOTE — Patient Instructions (Signed)
Medication Instructions:  Your physician recommends that you continue on your current medications as directed. Please refer to the Current Medication list given to you today.   Labwork: none  Testing/Procedures: Your physician has requested that you have an echocardiogram. Echocardiography is a painless test that uses sound waves to create images of your heart. It provides your doctor with information about the size and shape of your heart and how well your heart's chambers and valves are working. This procedure takes approximately one hour. There are no restrictions for this procedure.    Follow-Up: Your physician recommends that you schedule a follow-up appointment in: 12 months. Please call our office in about 9 months to schedule this appointment    Any Other Special Instructions Will Be Listed Below (If Applicable).     If you need a refill on your cardiac medications before your next appointment, please call your pharmacy.

## 2017-05-08 NOTE — Progress Notes (Signed)
Chief Complaint  Patient presents with  . Coronary Artery Disease     History of Present Illness: 77 yo male with history of CAD, HTN, HLD here today for cardiac follow up. He has been followed in the past by Dr. Lia Foyer. He presented with chest pain in August 2009. Cardiac cath in 2009 with mild non-obstructive disease. He has Crohn's disease in remission. Nuclear stress test February 2016 without ischemia.   He is here today for follow up. The patient denies any chest pain, dyspnea, lower extremity edema, orthopnea, PND, dizziness, near syncope or syncope. He has rare palpitations. He has been working in the yard and using a chainsaw. He has some fatigue with moderate exertion. Some dyspnea on exertion.   Primary Care Physician: Owens Loffler, MD   Past Medical History:  Diagnosis Date  . Anal fistula   . CAD (coronary artery disease)   . Chest pain    in past/? heartburn/EKG normal  . Crohn disease ([place])   . Esophageal motility disorder   . GERD (gastroesophageal reflux disease)   . Hearing loss   . History of BPH   . History of esophagitis   . Hyperlipidemia   . Hyperplastic colon polyp   . Hypertension   . Internal hemorrhoids   . Right inguinal hernia     Past Surgical History:  Procedure Laterality Date  . EYE SURGERY     right eye  . HERNIA REPAIR     x 2  . LIPOMA EXCISION     chest  . PROSTATE SURGERY     x 2, TURP    Current Outpatient Medications  Medication Sig Dispense Refill  . amLODipine (NORVASC) 5 MG tablet TAKE 1 TABLET BY MOUTH  EVERY DAY 90 tablet 1  . aspirin 81 MG tablet Take 81 mg by mouth daily.    Marland Kitchen atorvastatin (LIPITOR) 20 MG tablet TAKE 1 TABLET BY MOUTH  DAILY 90 tablet 1  . folic acid (FOLVITE) 1 MG tablet Take 1 tablet (1 mg total) by mouth daily. 90 tablet 1  . hydrochlorothiazide (MICROZIDE) 12.5 MG capsule TAKE 1 CAPSULE BY MOUTH  DAILY 90 capsule 1  . latanoprost (XALATAN) 0.005 % ophthalmic solution Place 1 drop into the  right eye at bedtime.     Marland Kitchen losartan (COZAAR) 100 MG tablet TAKE 1 TABLET BY MOUTH  DAILY 90 tablet 1  . Multiple Vitamins-Minerals (CENTRUM PO) Take 1 tablet by mouth daily.      Marland Kitchen sulfaSALAzine (AZULFIDINE) 500 MG tablet Take 2 tablets (1,000 mg total) by mouth 2 (two) times daily. 360 tablet 3   No current facility-administered medications for this visit.     Allergies  Allergen Reactions  . Ace Inhibitors Other (See Comments)    REACTION: cough    Social History   Socioeconomic History  . Marital status: Married    Spouse name: Not on file  . Number of children: 2  . Years of education: Not on file  . Highest education level: Not on file  Occupational History  . Occupation: Restaurant manager, fast food: RETIRED  Social Needs  . Financial resource strain: Not on file  . Food insecurity:    Worry: Not on file    Inability: Not on file  . Transportation needs:    Medical: Not on file    Non-medical: Not on file  Tobacco Use  . Smoking status: Former Smoker    Packs/day: 1.00    Years:  20.00    Pack years: 20.00    Types: Cigarettes    Last attempt to quit: 01/24/1978    Years since quitting: 39.3  . Smokeless tobacco: Never Used  Substance and Sexual Activity  . Alcohol use: Yes    Alcohol/week: 0.6 - 2.4 oz    Types: 1 Standard drinks or equivalent per week    Comment: occasional wine or beer weekly  . Drug use: No  . Sexual activity: Never  Lifestyle  . Physical activity:    Days per week: Not on file    Minutes per session: Not on file  . Stress: Not on file  Relationships  . Social connections:    Talks on phone: Not on file    Gets together: Not on file    Attends religious service: Not on file    Active member of club or organization: Not on file    Attends meetings of clubs or organizations: Not on file    Relationship status: Not on file  . Intimate partner violence:    Fear of current or ex partner: Not on file    Emotionally abused:  Not on file    Physically abused: Not on file    Forced sexual activity: Not on file  Other Topics Concern  . Not on file  Social History Narrative   Daily caffeine    Family History  Problem Relation Age of Onset  . Colon polyps Mother   . Heart attack Father 70  . Colon cancer Neg Hx   . Esophageal cancer Neg Hx   . Rectal cancer Neg Hx   . Stomach cancer Neg Hx     Review of Systems:  As stated in the HPI and otherwise negative.   BP 128/70   Pulse 74   Ht 5' 10"  (1.778 m)   Wt 185 lb (83.9 kg)   SpO2 98%   BMI 26.54 kg/m   Physical Examination:  General: Well developed, well nourished, NAD  HEENT: OP clear, mucus membranes moist  SKIN: warm, dry. No rashes. Neuro: No focal deficits  Musculoskeletal: Muscle strength 5/5 all ext  Psychiatric: Mood and affect normal  Neck: No JVD, no carotid bruits, no thyromegaly, no lymphadenopathy.  Lungs:Clear bilaterally, no wheezes, rhonci, crackles Cardiovascular: Regular rate and rhythm. No murmurs, gallops or rubs. Abdomen:Soft. Bowel sounds present. Non-tender.  Extremities: No lower extremity edema. Pulses are 2 + in the bilateral DP/PT.  Cardiac cath 8/09: 1. Ventriculography was done in the RAO projection. Overall systolic  function appeared preserved. No definite segmental abnormalities  or contraction were identified.  2. The left main does demonstrate a little bit of tenting at the  ostium. Multiple views were obtained, and there was good efflux of  contrast back into the aorta. High-grade obstruction is not noted,  but the tenting would represent probably about a 30% overall  luminal reduction. The left main itself appears to be smooth.  3. The left anterior descending artery courses to the apex. The first  tiny diagonal branch in the LAO view does demonstrate some  narrowing, probably of 90%, but this is a 1-mm vessel or less. The  second diagonal branch is a large-caliber vessel free of critical  disease in  the mid and distal LAD. All appear free of significant  high-grade disease.  4. The ramus intermedius is a moderate-size vessel with minimal  proximal luminal irregularity.  5. The circumflex coronary artery provides 2 marginal branches and has  no significant obstruction.  6. The right coronary artery also was minimally tapered at the ostium.  The right coronary, however, is smooth throughout providing a  posterior descending and posterolateral branch with minimal or mild  plaquing at the origin of the PDA, which is not hemodynamically  significant.  EKG:  EKG is  ordered today. The ekg ordered today demonstrates   Recent Labs: 08/15/2016: ALT 23; BUN 13; Creatinine, Ser 0.91; Hemoglobin 14.5; Platelets 236.0; Potassium 4.6; Sodium 138   Lipid Panel    Component Value Date/Time   CHOL 166 08/15/2016 1058   TRIG 90.0 08/15/2016 1058   HDL 52.80 08/15/2016 1058   CHOLHDL 3 08/15/2016 1058   VLDL 18.0 08/15/2016 1058   LDLCALC 95 08/15/2016 1058   LDLDIRECT 160.7 06/16/2008 0913     Wt Readings from Last 3 Encounters:  05/08/17 185 lb (83.9 kg)  04/24/17 183 lb 4 oz (83.1 kg)  02/06/17 184 lb 12 oz (83.8 kg)     Other studies Reviewed: Additional studies/ records that were reviewed today include: . Review of the above records demonstrates:    Assessment and Plan:   1. CAD without angina: He has no chest pain. He is known to have mild CAD by cath in 2009 and normal stress testing in 2016. Will continue ASA and statin. No recent assessment of LV function. Given dyspnea and known CAD, will arrange echo. I do not think stress testing is indicated.   2. HTN: BP is well controlled.   3. HLD: LDL of 95 last year. Will need repeat lipids. He wishes to have this done in primary care this summer. He will arrange an appt with Dr. Lorelei Pont. If LDL not at goal of 70, will have to consider Repatha or Praluent since he cannot tolerate higher doses of statins.     4. Dyspnea on exertion:  Will arrange echo to assess LV function, exclude valve disease.   Current medicines are reviewed at length with the patient today.  The patient does not have concerns regarding medicines.  The following changes have been made:  no change  Labs/ tests ordered today include:   Orders Placed This Encounter  Procedures  . EKG 12-Lead  . ECHOCARDIOGRAM COMPLETE    Disposition:   FU with me in 12  months  Signed, Lauree Chandler, MD 05/08/2017 12:23 PM    [facility] [address], [place], [place]  [address] Phone: [phone]; Fax: [phone]

## 2017-05-26 ENCOUNTER — Other Ambulatory Visit: Payer: Self-pay

## 2017-05-26 ENCOUNTER — Ambulatory Visit (HOSPITAL_COMMUNITY): Payer: Medicare Other | Attending: Cardiology

## 2017-05-26 DIAGNOSIS — Z87891 Personal history of nicotine dependence: Secondary | ICD-10-CM | POA: Insufficient documentation

## 2017-05-26 DIAGNOSIS — E785 Hyperlipidemia, unspecified: Secondary | ICD-10-CM | POA: Insufficient documentation

## 2017-05-26 DIAGNOSIS — I2510 Atherosclerotic heart disease of native coronary artery without angina pectoris: Secondary | ICD-10-CM | POA: Diagnosis not present

## 2017-05-26 DIAGNOSIS — I10 Essential (primary) hypertension: Secondary | ICD-10-CM | POA: Insufficient documentation

## 2017-05-26 DIAGNOSIS — I071 Rheumatic tricuspid insufficiency: Secondary | ICD-10-CM | POA: Insufficient documentation

## 2017-05-26 DIAGNOSIS — R0609 Other forms of dyspnea: Secondary | ICD-10-CM | POA: Diagnosis not present

## 2017-05-26 MED ORDER — PERFLUTREN LIPID MICROSPHERE
1.0000 mL | INTRAVENOUS | Status: AC | PRN
  Administered 2017-05-26: 1 mL via INTRAVENOUS

## 2017-06-27 ENCOUNTER — Other Ambulatory Visit: Payer: Self-pay | Admitting: Family Medicine

## 2017-07-06 ENCOUNTER — Encounter: Payer: Self-pay | Admitting: *Deleted

## 2017-07-07 ENCOUNTER — Ambulatory Visit: Payer: Medicare Other | Admitting: Family Medicine

## 2017-07-07 ENCOUNTER — Other Ambulatory Visit: Payer: Self-pay

## 2017-07-07 ENCOUNTER — Encounter: Payer: Self-pay | Admitting: Family Medicine

## 2017-07-07 VITALS — BP 140/90 | HR 63 | Temp 98.3°F | Ht 70.0 in | Wt 182.2 lb

## 2017-07-07 DIAGNOSIS — R197 Diarrhea, unspecified: Secondary | ICD-10-CM

## 2017-07-07 DIAGNOSIS — R195 Other fecal abnormalities: Secondary | ICD-10-CM

## 2017-07-07 DIAGNOSIS — R1032 Left lower quadrant pain: Secondary | ICD-10-CM | POA: Insufficient documentation

## 2017-07-07 NOTE — Addendum Note (Signed)
Addended by: Ellamae Sia on: 07/07/2017 11:30 AM   Modules accepted: Orders

## 2017-07-07 NOTE — Progress Notes (Signed)
   Subjective:    Patient ID: Jeffery [name], male    DOB: 07/21/1940, 77 y.o.   MRN: [id number]  HPI  77 year old male pt of Dr. Lillie Fragmin with history of Crohn's in remission on sulfasalazine, anal fistula, colon polyps, esophagitis, esophageal motility disorder present with new onset abdominal pain.  He reports several weeks of abdominal pain, left lower abdominal pain, mild ache, intermittent increase but constant ache  Yesterday had BM with  Small grains or ice or grit. White flakes. Occ diarrhea, no blood in stool. No fever. Good energy. No flu like symtpoms, chills No weight loss .  no rectal itching. Wt Readings from Last 3 Encounters:  07/07/17 182 lb 4 oz (82.7 kg)  05/08/17 185 lb (83.9 kg)  04/24/17 183 lb 4 oz (83.1 kg)      No recent travel, did have fish at a restaurant which was different for him.  Has small dog, but he goes to vet a lot.   Colonscopy due this year Dr. Riley Kill.  Last one  02/2015 2 low risk polyps No tics mentioned.  Review of Systems  Constitutional: Negative for fatigue and fever.  HENT: Negative for ear pain.   Eyes: Negative for pain.  Respiratory: Negative for cough and shortness of breath.   Cardiovascular: Negative for chest pain, palpitations and leg swelling.  Gastrointestinal: Negative for abdominal pain.  Genitourinary: Negative for dysuria.  Musculoskeletal: Negative for arthralgias.  Neurological: Negative for syncope, light-headedness and headaches.  Psychiatric/Behavioral: Negative for dysphoric mood.       Objective:   Physical Exam  Constitutional: Vital signs are normal. He appears well-developed and well-nourished.  HENT:  Head: Normocephalic.  Right Ear: Hearing normal.  Left Ear: Hearing normal.  Nose: Nose normal.  Mouth/Throat: Oropharynx is clear and moist and mucous membranes are normal.  Neck: Trachea normal. Carotid bruit is not present. No thyroid mass and no thyromegaly present.  Cardiovascular: Normal  rate, regular rhythm and normal pulses. Exam reveals no gallop, no distant heart sounds and no friction rub.  No murmur heard. No peripheral edema  Pulmonary/Chest: Effort normal and breath sounds normal. No respiratory distress.  Abdominal: Soft. Normal appearance and bowel sounds are normal. There is no tenderness. There is no rigidity and no CVA tenderness.  Skin: Skin is warm, dry and intact. No rash noted.  Psychiatric: He has a normal mood and affect. His speech is normal and behavior is normal. Thought content normal.          Assessment & Plan:

## 2017-07-07 NOTE — Assessment & Plan Note (Signed)
Possible flare of crohn's ut pt does not feel pain is typical. Also no abd pain on exam today, no history of tics.  Given findings in stool.Marland Kitchen He request parasite examination of stool.

## 2017-07-07 NOTE — Patient Instructions (Addendum)
Please stop at the lab to  Drop off stool test.  If pain increasing or new symptoms call for re-eval with PCP.

## 2017-07-10 LAB — OVA AND PARASITE EXAMINATION
CONCENTRATE RESULT: NONE SEEN
SPECIMEN QUALITY: ADEQUATE
TRICHROME RESULT: NONE SEEN
VKL: 90716106

## 2017-07-31 ENCOUNTER — Encounter: Payer: Medicare Other | Admitting: Family Medicine

## 2017-08-29 ENCOUNTER — Other Ambulatory Visit: Payer: Self-pay | Admitting: Family Medicine

## 2017-08-29 ENCOUNTER — Other Ambulatory Visit: Payer: Self-pay | Admitting: Internal Medicine

## 2017-08-29 DIAGNOSIS — Z125 Encounter for screening for malignant neoplasm of prostate: Secondary | ICD-10-CM

## 2017-08-29 DIAGNOSIS — E7800 Pure hypercholesterolemia, unspecified: Secondary | ICD-10-CM

## 2017-08-29 DIAGNOSIS — Z79899 Other long term (current) drug therapy: Secondary | ICD-10-CM

## 2017-08-30 ENCOUNTER — Other Ambulatory Visit: Payer: Self-pay | Admitting: Family Medicine

## 2017-08-30 NOTE — Progress Notes (Signed)
Opened in error

## 2017-08-31 ENCOUNTER — Other Ambulatory Visit (INDEPENDENT_AMBULATORY_CARE_PROVIDER_SITE_OTHER): Payer: Medicare Other

## 2017-08-31 ENCOUNTER — Other Ambulatory Visit: Payer: Self-pay | Admitting: Family Medicine

## 2017-08-31 DIAGNOSIS — Z125 Encounter for screening for malignant neoplasm of prostate: Secondary | ICD-10-CM

## 2017-08-31 DIAGNOSIS — Z79899 Other long term (current) drug therapy: Secondary | ICD-10-CM

## 2017-08-31 DIAGNOSIS — E7800 Pure hypercholesterolemia, unspecified: Secondary | ICD-10-CM

## 2017-08-31 LAB — CBC WITH DIFFERENTIAL/PLATELET
BASOS PCT: 1.1 % (ref 0.0–3.0)
Basophils Absolute: 0.1 10*3/uL (ref 0.0–0.1)
EOS PCT: 5.8 % — AB (ref 0.0–5.0)
Eosinophils Absolute: 0.3 10*3/uL (ref 0.0–0.7)
HEMATOCRIT: 39.2 % (ref 39.0–52.0)
HEMOGLOBIN: 13.3 g/dL (ref 13.0–17.0)
Lymphocytes Relative: 28.7 % (ref 12.0–46.0)
Lymphs Abs: 1.6 10*3/uL (ref 0.7–4.0)
MCHC: 33.9 g/dL (ref 30.0–36.0)
MCV: 92.1 fl (ref 78.0–100.0)
MONO ABS: 0.7 10*3/uL (ref 0.1–1.0)
Monocytes Relative: 13 % — ABNORMAL HIGH (ref 3.0–12.0)
Neutro Abs: 2.8 10*3/uL (ref 1.4–7.7)
Neutrophils Relative %: 51.4 % (ref 43.0–77.0)
Platelets: 238 10*3/uL (ref 150.0–400.0)
RBC: 4.26 Mil/uL (ref 4.22–5.81)
RDW: 13.4 % (ref 11.5–15.5)
WBC: 5.5 10*3/uL (ref 4.0–10.5)

## 2017-08-31 LAB — LIPID PANEL
CHOLESTEROL: 146 mg/dL (ref 0–200)
HDL: 50.3 mg/dL (ref >39.00)
LDL Cholesterol: 85 mg/dL (ref 0–99)
NonHDL: 95.35
TRIGLYCERIDES: 51 mg/dL (ref 0.0–149.0)
Total CHOL/HDL Ratio: 3
VLDL: 10.2 mg/dL (ref 0.0–40.0)

## 2017-08-31 LAB — BASIC METABOLIC PANEL
BUN: 17 mg/dL (ref 6–23)
CO2: 29 mEq/L (ref 19–32)
Calcium: 9.3 mg/dL (ref 8.4–10.5)
Chloride: 105 mEq/L (ref 96–112)
Creatinine, Ser: 1.02 mg/dL (ref 0.40–1.50)
GFR: 75.33 mL/min (ref >60.00)
Glucose, Bld: 99 mg/dL (ref 70–99)
Potassium: 4.2 mEq/L (ref 3.5–5.1)
Sodium: 141 mEq/L (ref 135–145)

## 2017-08-31 LAB — HEPATIC FUNCTION PANEL
ALT: 17 U/L (ref 0–53)
AST: 19 U/L (ref 0–37)
Albumin: 4.2 g/dL (ref 3.5–5.2)
Alkaline Phosphatase: 50 U/L (ref 39–117)
BILIRUBIN TOTAL: 1 mg/dL (ref 0.2–1.2)
Bilirubin, Direct: 0.2 mg/dL (ref 0.0–0.3)
TOTAL PROTEIN: 6.4 g/dL (ref 6.0–8.3)

## 2017-08-31 LAB — PSA, MEDICARE: PSA: 1.12 ng/ml (ref 0.10–4.00)

## 2017-09-07 ENCOUNTER — Ambulatory Visit: Payer: Medicare Other | Admitting: Family Medicine

## 2017-09-07 ENCOUNTER — Encounter: Payer: Self-pay | Admitting: Family Medicine

## 2017-09-07 VITALS — BP 126/72 | HR 66 | Temp 98.3°F | Ht 70.0 in | Wt 183.0 lb

## 2017-09-07 DIAGNOSIS — Z0000 Encounter for general adult medical examination without abnormal findings: Secondary | ICD-10-CM | POA: Diagnosis not present

## 2017-09-07 NOTE — Progress Notes (Signed)
Dr. Frederico Hamman T. Keiara Sneeringer, MD, [facility] [place], [address] Phone: [phone] Fax: [phone]  09/07/2017  Patient: Jeffery [name], MRN: [id number], DOB: Jul 07, 1940, 77 y.o.  Primary Physician:  Owens Loffler, MD   Chief Complaint  Patient presents with  . Medicare Wellness   Subjective:   Jeffery [name] is a 77 y.o. pleasant patient who presents for a medicare wellness examination:  Preventative Health Maintenance Visit:  Health Maintenance Summary Reviewed and updated, unless pt declines services.  Tobacco History Reviewed. Alcohol: No concerns, no excessive use Exercise Habits: 3-4 days a week STD concerns: no risk or activity to increase risk Drug Use: None Encouraged self-testicular check  Rapatha or Praluent? [place] LDL 85  Globally he is still doing well - feeling good and is active  Health Maintenance  Topic Date Due  . COLONOSCOPY  03/03/2018  . TETANUS/TDAP  08/03/2022  . INFLUENZA VACCINE  Completed  . PNA vac Low Risk Adult  Completed    Immunization History  Administered Date(s) Administered  . Influenza Split 11/10/2010, 10/03/2011  . Influenza Whole 12/29/2008  . Influenza,inj,Quad PF,6+ Mos 11/20/2012, 12/25/2013, 11/27/2014, 10/20/2015, 11/03/2016  . Influenza-Unspecified 11/07/2013  . Pneumococcal Conjugate-13 08/07/2013  . Pneumococcal Polysaccharide-23 08/07/2008  . Tdap 08/02/2012  . Zoster 08/07/2008    Patient Active Problem List   Diagnosis Date Noted  . Age-related nuclear cataract of left eye 05/03/2016  . Primary open angle glaucoma of right eye, moderate stage 05/03/2016  . OTHER SPECIFIED FORMS OF HEARING LOSS 08/10/2009  . HYPERCHOLESTEROLEMIA  IIA 11/25/2008  . BENIGN PROSTATIC HYPERTROPHY, HX OF, S/P TURP 08/07/2008  . INTERNAL HEMORRHOIDS 05/14/2008  . ANAL FISTULA 05/14/2008  . COLONIC POLYPS, HYPERPLASTIC, HX OF 05/14/2008  . ESOPHAGITIS, HX OF  05/14/2008  . HYPERTENSION, BENIGN 05/06/2008  . CAD 05/06/2008  . Crohn's disease ([place]) 05/06/2008   Past Medical History:  Diagnosis Date  . Anal fistula   . CAD (coronary artery disease)   . Crohn disease ([place])   . Esophageal motility disorder   . GERD (gastroesophageal reflux disease)   . Hearing loss   . History of BPH   . History of esophagitis   . Hyperlipidemia   . Hyperplastic colon polyp   . Hypertension   . Internal hemorrhoids   . Right inguinal hernia    Past Surgical History:  Procedure Laterality Date  . EYE SURGERY     right eye  . HERNIA REPAIR     x 2  . LIPOMA EXCISION     chest  . PROSTATE SURGERY     x 2, TURP   Social History   Socioeconomic History  . Marital status: Married    Spouse name: Not on file  . Number of children: 2  . Years of education: Not on file  . Highest education level: Not on file  Occupational History  . Occupation: Restaurant manager, fast food: RETIRED  Social Needs  . Financial resource strain: Not on file  . Food insecurity:    Worry: Not on file    Inability: Not on file  . Transportation needs:    Medical: Not on file    Non-medical: Not on file  Tobacco Use  . Smoking status: Former Smoker    Packs/day: 1.00    Years: 20.00    Pack years: 20.00    Types: Cigarettes    Last attempt to quit: 01/24/1978  Years since quitting: 39.6  . Smokeless tobacco: Never Used  Substance and Sexual Activity  . Alcohol use: Yes    Alcohol/week: 1.0 - 4.0 standard drinks    Types: 1 Standard drinks or equivalent per week    Comment: occasional wine or beer weekly  . Drug use: No  . Sexual activity: Never  Lifestyle  . Physical activity:    Days per week: Not on file    Minutes per session: Not on file  . Stress: Not on file  Relationships  . Social connections:    Talks on phone: Not on file    Gets together: Not on file    Attends religious service: Not on file    Active member of club or  organization: Not on file    Attends meetings of clubs or organizations: Not on file    Relationship status: Not on file  . Intimate partner violence:    Fear of current or ex partner: Not on file    Emotionally abused: Not on file    Physically abused: Not on file    Forced sexual activity: Not on file  Other Topics Concern  . Not on file  Social History Narrative   Daily caffeine   Family History  Problem Relation Age of Onset  . Colon polyps Mother   . Heart attack Father 11  . Colon cancer Neg Hx   . Esophageal cancer Neg Hx   . Rectal cancer Neg Hx   . Stomach cancer Neg Hx    Allergies  Allergen Reactions  . Ace Inhibitors Other (See Comments)    REACTION: cough    Medication list has been reviewed and updated.   General: Denies fever, chills, sweats. No significant weight loss. Eyes: Denies blurring,significant itching ENT: Denies earache, sore throat, and hoarseness. Cardiovascular: Denies chest pains, palpitations, dyspnea on exertion Respiratory: Denies cough, dyspnea at rest,wheeezing Breast: no concerns about lumps GI: Denies nausea, vomiting, diarrhea, constipation, change in bowel habits, abdominal pain, melena, hematochezia GU: Denies penile discharge, ED, urinary flow / outflow problems. No STD concerns. Musculoskeletal: Denies back pain, joint pain Derm: Denies rash, itching Neuro: Denies  paresthesias, frequent falls, frequent headaches Psych: Denies depression, anxiety Endocrine: Denies cold intolerance, heat intolerance, polydipsia Heme: Denies enlarged lymph nodes Allergy: No hayfever  Objective:   BP 126/72   Pulse 66   Temp 98.3 F (36.8 C) (Oral)   Ht 5' 10"  (1.778 m)   Wt 183 lb (83 kg)   BMI 26.26 kg/m   The patient completed a fall screen and PHQ-2 and PHQ-9 if necessary, which is documented in the EHR. The CMA/LPN/RN who assisted the patient verbally completed with them and documented results in [place].  Hearing  Screening Comments: Wears Bilateral Hearing Aides Vision Screening Comments: Wears Glasses-Eye Exam [facility] 03/08/2017 (Dr. Volanda Napoleon)  GEN: well developed, well nourished, no acute distress Eyes: conjunctiva and lids normal, PERRLA, EOMI ENT: TM clear, nares clear, oral exam WNL Neck: supple, no lymphadenopathy, no thyromegaly, no JVD Pulm: clear to auscultation and percussion, respiratory effort normal CV: regular rate and rhythm, S1-S2, no murmur, rub or gallop, no bruits, peripheral pulses normal and symmetric, no cyanosis, clubbing, edema or varicosities GI: soft, non-tender; no hepatosplenomegaly, masses; active bowel sounds all quadrants GU: no hernia, testicular mass, penile discharge Lymph: no cervical, axillary or inguinal adenopathy MSK: gait normal, muscle tone and strength WNL, no joint swelling, effusions, discoloration, crepitus  SKIN: clear, good  turgor, color WNL, no rashes, lesions, or ulcerations Neuro: normal mental status, normal strength, sensation, and motion Psych: alert; oriented to person, place and time, normally interactive and not anxious or depressed in appearance.  All labs reviewed with patient.  Lipids:    Component Value Date/Time   CHOL 146 08/31/2017 0830   TRIG 51.0 08/31/2017 0830   HDL 50.30 08/31/2017 0830   LDLDIRECT 160.7 06/16/2008 0913   VLDL 10.2 08/31/2017 0830   CHOLHDL 3 08/31/2017 0830   CBC: CBC Latest Ref Rng & Units 08/31/2017 08/15/2016 02/15/2016  WBC 4.0 - 10.5 K/uL 5.5 5.2 6.1  Hemoglobin 13.0 - 17.0 g/dL 13.3 14.5 13.8  Hematocrit 39.0 - 52.0 % 39.2 43.9 41.1  Platelets 150.0 - 400.0 K/uL 238.0 236.0 681.1    Basic Metabolic Panel:    Component Value Date/Time   NA 141 08/31/2017 0830   K 4.2 08/31/2017 0830   CL 105 08/31/2017 0830   CO2 29 08/31/2017 0830   BUN 17 08/31/2017 0830   CREATININE 1.02 08/31/2017 0830   GLUCOSE 99 08/31/2017 0830   CALCIUM 9.3 08/31/2017 0830   Hepatic Function Latest Ref Rng & Units  08/31/2017 08/15/2016 02/15/2016  Total Protein 6.0 - 8.3 g/dL 6.4 6.6 7.0  Albumin 3.5 - 5.2 g/dL 4.2 4.5 4.3  AST 0 - 37 U/L 19 23 20   ALT 0 - 53 U/L 17 23 20   Alk Phosphatase 39 - 117 U/L 50 54 51  Total Bilirubin 0.2 - 1.2 mg/dL 1.0 1.3(H) 1.0  Bilirubin, Direct 0.0 - 0.3 mg/dL 0.2 0.2 0.3    Lab Results  Component Value Date   TSH 3.33 08/02/2013   Lab Results  Component Value Date   PSA 1.12 08/31/2017   PSA 1.23 08/15/2016   PSA 1.09 08/10/2015    Assessment and Plan:   Healthcare maintenance   Globally Chol is better, total 140 with HDL 50, LDL 85. He wanted me to pass on to Dr. Serita Sheller, as they were considering Rapatha or Praluent earlier in the year.   Health Maintenance Exam: The patient's preventative maintenance and recommended screening tests for an annual wellness exam were reviewed in full today. Brought up to date unless services declined.  Counselled on the importance of diet, exercise, and its role in overall health and mortality. The patient's FH and SH was reviewed, including their home life, tobacco status, and drug and alcohol status.  Follow-up in 1 year for physical exam or additional follow-up below.  I have personally reviewed the Medicare Annual Wellness questionnaire and have noted 1. The patient's medical and social history 2. Their use of alcohol, tobacco or illicit drugs 3. Their current medications and supplements 4. The patient's functional ability including ADL's, fall risks, home safety risks and hearing or visual             impairment. 5. Diet and physical activities 6. Evidence for depression or mood disorders 7. Reviewed Updated provider list, see scanned forms and [facility] Snapshot.   The patients weight, height, BMI and visual acuity have been recorded in the chart I have made referrals, counseling and provided education to the patient based review of the above and I have provided the pt with a written personalized care plan for  preventive services.  I have provided the patient with a copy of your personalized plan for preventive services. Instructed to take the time to review along with their updated medication list.  Follow-up: Return in about 1 year (around  09/08/2018). Or follow-up in 1 year if not noted.  Signed,  Maud Deed. Loreto Loescher, MD   Allergies as of 09/07/2017      Reactions   Ace Inhibitors Other (See Comments)   REACTION: cough      Medication List        Accurate as of 09/07/17 11:59 PM. Always use your most recent med list.          amLODipine 5 MG tablet Commonly known as:  NORVASC TAKE 1 TABLET BY MOUTH  EVERY DAY   aspirin 81 MG tablet Take 81 mg by mouth daily.   atorvastatin 20 MG tablet Commonly known as:  LIPITOR TAKE 1 TABLET BY MOUTH  DAILY   CENTRUM PO Take 1 tablet by mouth daily.   folic acid 1 MG tablet Commonly known as:  FOLVITE Take 1 tablet (1 mg total) by mouth daily.   hydrochlorothiazide 12.5 MG capsule Commonly known as:  MICROZIDE TAKE 1 CAPSULE BY MOUTH  DAILY   latanoprost 0.005 % ophthalmic solution Commonly known as:  XALATAN Place 1 drop into the right eye at bedtime.   losartan 100 MG tablet Commonly known as:  COZAAR TAKE 1 TABLET BY MOUTH  DAILY   sulfaSALAzine 500 MG tablet Commonly known as:  AZULFIDINE Take 2 tablets (1,000 mg total) by mouth 2 (two) times daily.

## 2017-09-08 DIAGNOSIS — H2512 Age-related nuclear cataract, left eye: Secondary | ICD-10-CM | POA: Diagnosis not present

## 2017-09-08 DIAGNOSIS — H401130 Primary open-angle glaucoma, bilateral, stage unspecified: Secondary | ICD-10-CM | POA: Diagnosis not present

## 2017-09-08 DIAGNOSIS — Z961 Presence of intraocular lens: Secondary | ICD-10-CM | POA: Diagnosis not present

## 2017-09-10 ENCOUNTER — Encounter: Payer: Self-pay | Admitting: Family Medicine

## 2017-09-11 ENCOUNTER — Telehealth: Payer: Self-pay | Admitting: *Deleted

## 2017-09-11 DIAGNOSIS — E7800 Pure hypercholesterolemia, unspecified: Secondary | ICD-10-CM

## 2017-09-11 NOTE — Telephone Encounter (Signed)
I spoke with pt and scheduled him to be seen in lipid clinic on September 9,2019 at 2:00

## 2017-09-11 NOTE — Telephone Encounter (Signed)
Dr. Angelena Form would like pt to be seen in Lipid clinic in [facility] office. I placed call to pt to schedule this appointment. Left message to call back

## 2017-09-15 ENCOUNTER — Other Ambulatory Visit: Payer: Self-pay | Admitting: Internal Medicine

## 2017-10-02 ENCOUNTER — Ambulatory Visit: Payer: Medicare Other

## 2017-10-19 ENCOUNTER — Ambulatory Visit (INDEPENDENT_AMBULATORY_CARE_PROVIDER_SITE_OTHER): Payer: Medicare Other | Admitting: Pharmacist

## 2017-10-19 ENCOUNTER — Encounter: Payer: Self-pay | Admitting: Pharmacist

## 2017-10-19 DIAGNOSIS — E7800 Pure hypercholesterolemia, unspecified: Secondary | ICD-10-CM | POA: Diagnosis not present

## 2017-10-19 MED ORDER — ATORVASTATIN CALCIUM 40 MG PO TABS: 40.0000 mg | ORAL_TABLET | Freq: Every day | ORAL | 1 refills | Status: DC

## 2017-10-19 NOTE — Patient Instructions (Addendum)
INCREASE atorvastatin to 77m daily (you may take 2 tablets of you current supply until you run out, then take 1 tablet of higher strength (459m)  If you have any issues tolerating the dose increase please call the clinic [phone]  Cholesterol Cholesterol is a fat. Your body needs a small amount of cholesterol. Cholesterol (plaque) may build up in your blood vessels (arteries). That makes you more likely to have a heart attack or stroke. You cannot feel your cholesterol level. Having a blood test is the only way to find out if your level is high. Keep your test results. Work with your doctor to keep your cholesterol at a good level. What do the results mean?  Total cholesterol is how much cholesterol is in your blood.  LDL is bad cholesterol. This is the type that can build up. Try to have low LDL.  HDL is good cholesterol. It cleans your blood vessels and carries LDL away. Try to have high HDL.  Triglycerides are fat that the body can store or burn for energy. What are good levels of cholesterol?  Total cholesterol below 200.  LDL below 100 is good for people who have health risks. LDL below 70 is good for people who have very high risks.  HDL above 40 is good. It is best to have HDL of 60 or higher.  Triglycerides below 150. How can I lower my cholesterol? Diet Follow your diet program as told by your doctor.  Choose fish, white meat chicken, or tu[place]uwa[place]at is roasted or baked. Try not to eat red meat, fried foods, sausage, or lunch meats.  Eat lots of fresh fruits and vegetables.  Choose whole grains, beans, pasta, potatoes, and cereals.  Choose olive oil, corn oil, or canola oil. Only use small amounts.  Try not to eat butter, mayonnaise, shortening, or palm kernel oils.  Try not to eat foods with trans fats.  Choose low-fat or nonfat dairy foods. ? Drink skim or nonfat milk. ? Eat low-fat or nonfat yogurt and cheeses. ? Try not to drink whole milk or  cream. ? Try not to eat ice cream, egg yolks, or full-fat cheeses.  Healthy desserts include angel food cake, ginger snaps, animal crackers, hard candy, popsicles, and low-fat or nonfat frozen yogurt. Try not to eat pastries, cakes, pies, and cookies.  Exercise Follow your exercise program as told by your doctor.  Be more active. Try gardening, walking, and taking the stairs.  Ask your doctor about ways that you can be more active.  Medicine  Take over-the-counter and prescription medicines only as told by your doctor. This information is not intended to replace advice given to you by your health care provider. Make sure you discuss any questions you have with your health care provider. Document Released: 04/08/2008 Document Revised: 08/12/2015 Document Reviewed: 07/23/2015 Elsevier Interactive Patient Education  20[employer]

## 2017-10-19 NOTE — Progress Notes (Signed)
Patient ID: Jeffery [name]                 DOB: 10-29-1940                    MRN: [id number]     HPI: Jeffery [name] is a 77 y.o. male patient of Dr. Angelena Form that presents today for lipid evaluation.  PMH includes CAD, HTN, HLD. He has had difficulty with increased doses of statin medications.   He presents today for discussion of cholesterol. He states that he tolerates his atorvastatin better than the rosuvastatin. He gets leg cramps and in hands currently, but he is unsure if this is related to the medication. He does state that when he took Crestor he ached terribly and this stopped once the medication was discontinued. He does not recall taking a higher dose of atorvastatin. His wife is taking PCSK9i therapy and she is doing well on this, but cost is high.   Risk Factors: CAD  LDL Goal: <70,   Current Medications: atorvastatin 53m daily  Intolerances:  Crestor (muscle and joint pain),   Diet: He eats out and from home. He eats pork, beef, fish. He is mindful about portions. He eats salads as well. He does eat vegetables regularly. He uses olive oil or grills foods at home. He drinks mostly water and iced tea. He does occasionally have soft drink.   Exercise: He is active and walks. [place]. He does not have a daily routine. He is an outside person rather than inside.   Family History: father with MI  Social History: former smoker, occasional alcohol (glass of wine or beer)  Labs: 08/31/17:  TC 146, TG 51, HDL 50, LDL 85, nonHDL 95 (atorvastatin 255mdaily)  Past Medical History:  Diagnosis Date  . Anal fistula   . CAD (coronary artery disease)   . Crohn disease ([place]  . Esophageal motility disorder   . GERD (gastroesophageal reflux disease)   . Hearing loss   . History of BPH   . History of esophagitis   . Hyperlipidemia   . Hyperplastic colon polyp   . Hypertension   . Internal hemorrhoids   . Right inguinal hernia     Current Outpatient  Medications on File Prior to Visit  Medication Sig Dispense Refill  . amLODipine (NORVASC) 5 MG tablet TAKE 1 TABLET BY MOUTH  EVERY DAY 90 tablet 0  . aspirin 81 MG tablet Take 81 mg by mouth daily.    . Marland Kitchentorvastatin (LIPITOR) 20 MG tablet TAKE 1 TABLET BY MOUTH  DAILY 90 tablet 0  . folic acid (FOLVITE) 1 MG tablet TAKE 1 TABLET BY MOUTH  DAILY 90 tablet 0  . hydrochlorothiazide (MICROZIDE) 12.5 MG capsule TAKE 1 CAPSULE BY MOUTH  DAILY 90 capsule 0  . latanoprost (XALATAN) 0.005 % ophthalmic solution Place 1 drop into the right eye at bedtime.     . Marland Kitchenosartan (COZAAR) 100 MG tablet TAKE 1 TABLET BY MOUTH  DAILY 90 tablet 0  . Multiple Vitamins-Minerals (CENTRUM PO) Take 1 tablet by mouth daily.      . Marland KitchenulfaSALAzine (AZULFIDINE) 500 MG tablet Take 2 tablets (1,000 mg total) by mouth 2 (two) times daily. 360 tablet 3   No current facility-administered medications on file prior to visit.     Allergies  Allergen Reactions  . Ace Inhibitors Other (See Comments)    REACTION: cough    Assessment/Plan:  Hyperlipidemia: LDL is not at goal <70. Discussed options including increasing dose of atorvastatin, ezetimibe, and PCSK9i therapy. He would like to increase his atorvastatin first to see how he tolerates. Will increase to 15m daily and call in 1 month to check symptoms. If tolerating well will consider dose increase vs. Recheck cholesterol panel to evaluate efficacy.    Thank you,  KLelan Pons APatterson Hammers[facility]  10/19/2017 8:18 AM

## 2017-11-03 ENCOUNTER — Other Ambulatory Visit: Payer: Self-pay | Admitting: Family Medicine

## 2017-11-03 ENCOUNTER — Other Ambulatory Visit: Payer: Self-pay | Admitting: Internal Medicine

## 2017-11-20 ENCOUNTER — Telehealth: Payer: Self-pay | Admitting: Pharmacist

## 2017-11-20 DIAGNOSIS — E7800 Pure hypercholesterolemia, unspecified: Secondary | ICD-10-CM

## 2017-11-20 NOTE — Telephone Encounter (Signed)
Spoke with patient to follow up on increased dose of atorvastatin. He states he has noticed some increased muscle aches, but he is able to tolerate these for now.   Will recheck a cholesterol panel in 1 month to evaluate efficacy/need to titrate or add ezetimibe.

## 2017-11-24 ENCOUNTER — Encounter: Payer: Self-pay | Admitting: Family Medicine

## 2017-11-24 ENCOUNTER — Ambulatory Visit: Payer: Medicare Other | Admitting: Family Medicine

## 2017-11-24 VITALS — BP 128/70 | HR 65 | Temp 97.7°F | Wt 185.0 lb

## 2017-11-24 DIAGNOSIS — Z8719 Personal history of other diseases of the digestive system: Secondary | ICD-10-CM | POA: Diagnosis not present

## 2017-11-24 DIAGNOSIS — R1310 Dysphagia, unspecified: Secondary | ICD-10-CM | POA: Insufficient documentation

## 2017-11-24 MED ORDER — OMEPRAZOLE 40 MG PO CPDR: 40.0000 mg | DELAYED_RELEASE_CAPSULE | Freq: Every day | ORAL | 3 refills | Status: DC

## 2017-11-24 NOTE — Patient Instructions (Addendum)
Avoid reflux triggers as discussed.  Start  omeprazole 40 mg daily.  Call Dr. Hilarie Fredrickson for appt for dysphagia.

## 2017-11-24 NOTE — Progress Notes (Signed)
   Subjective:    Patient ID: Jeffery [name], male    DOB: [birth year], 77 y.o.   MRN: [id number]  HPI   77 year old male pt of  Dr. Lillie Fragmin presents for worsening dysphagia.   He reports that over the summer he would have some trouble getting watermelon stuck in throat.. Progressed and now any food even liquids getting hung in throat. No GERD, no burping... Not on PPI. Some clearing throat, some mucus in throat.  Endoscopy 2000  On prevacid in past Dr. Olevia Perches... Currently seeing Dr. Hilarie Fredrickson... Has upcoming colonoscopy in 2.2019.  Hx of Crohn's disease, esophagitis.  Blood pressure 128/70, pulse 65, temperature 97.7 F (36.5 C), temperature source Oral, weight 185 lb (83.9 kg), SpO2 98 %. Social History /Family History/Past Medical History reviewed in detail and updated in EMR if needed.   Review of Systems  Constitutional: Negative for fatigue and fever.  HENT: Negative for ear pain.   Eyes: Negative for pain.  Respiratory: Negative for shortness of breath.   Cardiovascular: Negative for chest pain, palpitations and leg swelling.  Gastrointestinal: Negative for abdominal pain.  Genitourinary: Negative for dysuria.  Musculoskeletal: Negative for arthralgias.  Neurological: Negative for syncope, light-headedness and headaches.  Psychiatric/Behavioral: Negative for dysphoric mood.       Objective:   Physical Exam  Constitutional: Vital signs are normal. He appears well-developed and well-nourished.  HENT:  Head: Normocephalic.  Right Ear: Hearing normal.  Left Ear: Hearing normal.  Nose: Nose normal.  Mouth/Throat: Oropharynx is clear and moist and mucous membranes are normal.  Neck: Trachea normal. Carotid bruit is not present. No thyroid mass and no thyromegaly present.  Cardiovascular: Normal rate, regular rhythm and normal pulses. Exam reveals no gallop, no distant heart sounds and no friction rub.  No murmur heard. No peripheral edema  Pulmonary/Chest: Effort normal  and breath sounds normal. No respiratory distress.  Skin: Skin is warm, dry and intact. No rash noted.  Psychiatric: He has a normal mood and affect. His speech is normal and behavior is normal. Thought content normal.          Assessment & Plan:

## 2017-11-24 NOTE — Assessment & Plan Note (Addendum)
Likely due to occult GERD/esophagitis/stricture/ less likely mass... Start PPI and pt will call to make an appt for possible ENDO repeat with Dr. Hilarie Fredrickson. GERD Trigger avoidance.

## 2017-12-25 ENCOUNTER — Other Ambulatory Visit: Payer: Medicare Other | Admitting: *Deleted

## 2017-12-25 DIAGNOSIS — E7800 Pure hypercholesterolemia, unspecified: Secondary | ICD-10-CM

## 2017-12-25 LAB — LIPID PANEL
CHOLESTEROL TOTAL: 138 mg/dL (ref 100–199)
Chol/HDL Ratio: 2.9 ratio (ref 0.0–5.0)
HDL: 48 mg/dL (ref >39)
LDL Calculated: 77 mg/dL (ref 0–99)
Triglycerides: 64 mg/dL (ref 0–149)
VLDL CHOLESTEROL CAL: 13 mg/dL (ref 5–40)

## 2017-12-25 LAB — HEPATIC FUNCTION PANEL
ALBUMIN: 4.3 g/dL (ref 3.5–4.8)
ALK PHOS: 59 IU/L (ref 39–117)
ALT: 19 IU/L (ref 0–44)
AST: 20 IU/L (ref 0–40)
BILIRUBIN, DIRECT: 0.2 mg/dL (ref 0.00–0.40)
Bilirubin Total: 0.7 mg/dL (ref 0.0–1.2)
TOTAL PROTEIN: 6.3 g/dL (ref 6.0–8.5)

## 2017-12-26 ENCOUNTER — Telehealth: Payer: Self-pay | Admitting: Pharmacist

## 2017-12-26 DIAGNOSIS — E7800 Pure hypercholesterolemia, unspecified: Secondary | ICD-10-CM

## 2017-12-26 NOTE — Telephone Encounter (Signed)
-----   Message from Burnell Blanks, MD sent at 12/25/2017  4:09 PM EST ----- Georgina Peer, Here is the lipid panel you ordered on Mr. Korber.   Gerald Stabs

## 2017-12-26 NOTE — Telephone Encounter (Signed)
Pt returned call to clinic. He is tolerating atorvastatin 34m daily with some mild aches and pains. He is willing to continue current does but does not want to increase his atorvastatin or start ezetimibe at this time. Reports he was not eating as well over the holidays. Will recheck lipids in 2 months and if LDL remains > 70, discuss starting ezetimibe at that time.

## 2018-01-05 ENCOUNTER — Ambulatory Visit: Payer: Medicare Other | Admitting: Internal Medicine

## 2018-01-08 ENCOUNTER — Other Ambulatory Visit: Payer: Self-pay

## 2018-01-08 ENCOUNTER — Ambulatory Visit (AMBULATORY_SURGERY_CENTER): Payer: Medicare Other | Admitting: Internal Medicine

## 2018-01-08 ENCOUNTER — Encounter: Payer: Self-pay | Admitting: Internal Medicine

## 2018-01-08 ENCOUNTER — Other Ambulatory Visit (INDEPENDENT_AMBULATORY_CARE_PROVIDER_SITE_OTHER): Payer: Medicare Other

## 2018-01-08 ENCOUNTER — Telehealth: Payer: Self-pay

## 2018-01-08 ENCOUNTER — Ambulatory Visit: Payer: Medicare Other | Admitting: Internal Medicine

## 2018-01-08 VITALS — BP 139/72 | HR 76 | Temp 98.6°F | Resp 14 | Ht 70.0 in | Wt 181.0 lb

## 2018-01-08 VITALS — BP 132/70 | HR 69 | Ht 70.0 in | Wt 181.6 lb

## 2018-01-08 DIAGNOSIS — R1319 Other dysphagia: Secondary | ICD-10-CM

## 2018-01-08 DIAGNOSIS — K228 Other specified diseases of esophagus: Secondary | ICD-10-CM | POA: Diagnosis not present

## 2018-01-08 DIAGNOSIS — R1310 Dysphagia, unspecified: Secondary | ICD-10-CM | POA: Diagnosis not present

## 2018-01-08 DIAGNOSIS — K5010 Crohn's disease of large intestine without complications: Secondary | ICD-10-CM

## 2018-01-08 DIAGNOSIS — K2289 Other specified disease of esophagus: Secondary | ICD-10-CM

## 2018-01-08 DIAGNOSIS — C155 Malignant neoplasm of lower third of esophagus: Secondary | ICD-10-CM

## 2018-01-08 DIAGNOSIS — K219 Gastro-esophageal reflux disease without esophagitis: Secondary | ICD-10-CM | POA: Diagnosis not present

## 2018-01-08 LAB — CBC WITH DIFFERENTIAL/PLATELET
Basophils Absolute: 0.1 10*3/uL (ref 0.0–0.1)
Basophils Relative: 1 % (ref 0.0–3.0)
EOS PCT: 1.1 % (ref 0.0–5.0)
Eosinophils Absolute: 0.1 10*3/uL (ref 0.0–0.7)
HCT: 37.4 % — ABNORMAL LOW (ref 39.0–52.0)
Hemoglobin: 12.5 g/dL — ABNORMAL LOW (ref 13.0–17.0)
Lymphocytes Relative: 26.8 % (ref 12.0–46.0)
Lymphs Abs: 1.3 10*3/uL (ref 0.7–4.0)
MCHC: 33.4 g/dL (ref 30.0–36.0)
MCV: 87.6 fl (ref 78.0–100.0)
MONO ABS: 0.6 10*3/uL (ref 0.1–1.0)
MONOS PCT: 11.7 % (ref 3.0–12.0)
Neutro Abs: 2.9 10*3/uL (ref 1.4–7.7)
Neutrophils Relative %: 59.4 % (ref 43.0–77.0)
Platelets: 239 10*3/uL (ref 150.0–400.0)
RBC: 4.27 Mil/uL (ref 4.22–5.81)
RDW: 12.9 % (ref 11.5–15.5)
WBC: 4.9 10*3/uL (ref 4.0–10.5)

## 2018-01-08 LAB — COMPREHENSIVE METABOLIC PANEL
ALT: 15 U/L (ref 0–53)
AST: 18 U/L (ref 0–37)
Albumin: 4.2 g/dL (ref 3.5–5.2)
Alkaline Phosphatase: 47 U/L (ref 39–117)
BUN: 12 mg/dL (ref 6–23)
CO2: 27 mEq/L (ref 19–32)
Calcium: 9.1 mg/dL (ref 8.4–10.5)
Chloride: 102 mEq/L (ref 96–112)
Creatinine, Ser: 0.85 mg/dL (ref 0.40–1.50)
GFR: 92.88 mL/min (ref >60.00)
Glucose, Bld: 86 mg/dL (ref 70–99)
Potassium: 3.6 mEq/L (ref 3.5–5.1)
Sodium: 137 mEq/L (ref 135–145)
Total Bilirubin: 0.9 mg/dL (ref 0.2–1.2)
Total Protein: 6.4 g/dL (ref 6.0–8.3)

## 2018-01-08 LAB — FERRITIN: Ferritin: 25.1 ng/mL (ref 22.0–322.0)

## 2018-01-08 LAB — IBC PANEL
IRON: 86 ug/dL (ref 42–165)
Saturation Ratios: 27.8 % (ref 20.0–50.0)
Transferrin: 221 mg/dL (ref 212.0–360.0)

## 2018-01-08 MED ORDER — SODIUM CHLORIDE 0.9 % IV SOLN: 500.0000 mL | Freq: Once | INTRAVENOUS | Status: DC

## 2018-01-08 NOTE — Progress Notes (Signed)
Called to room to assist during endoscopic procedure.  Patient ID and intended procedure confirmed with present staff. Received instructions for my participation in the procedure from the performing physician.  

## 2018-01-08 NOTE — Progress Notes (Signed)
Subjective:    Patient ID: Jeffery [name], male    DOB: 12-Jan-1941, 77 y.o.   MRN: [id number]  HPI Jeffery [name] is a 77 year old male with a history of Crohn's colitis (primarily left-sided and diagnosed in 1986), GERD and hypertension plus hyperlipidemia who is here for follow-up to evaluate dysphagia.  He is here alone today.  He was last seen on 04/24/2017.  He reports that over the last 6 months he has been experiencing trouble swallowing.  Started in the summertime when eating watermelon.  Has been progressive and now is involving both solids and liquids.  Does feel some chest pressure and discomfort associated with swallowing.  He has noticed some coughing and mucus with throat clearing.  Primary care started omeprazole 40 mg a month ago he cannot tell that this is helped.  He is not having true heartburn.  He is trying to eat slowly and chew his food very well.  With carbonated beverages he has hiccups and belching.  Pills can hang up in his esophagus but eventually passed.  No nausea or vomiting.  No weight loss.  From a colitis perspective no major changes he is doing the sulfasalazine 1 g twice daily and folic acid.  Occasionally he will have some lower or left sided abdominal discomfort but this is usually short lived.  No pain today.  No diarrhea.  Possibly occasionally feels constipated.  No blood in his stool or melena.  On review of records he had an endoscopy in 2000, mild esophagitis was seen by Dr. Olevia Perches.  I do not see associated pathology record from this date  Review of Systems As per HPI, otherwise negative    Objective:   Physical Exam BP 132/70   Pulse 69   Ht 5' 10"  (1.778 m)   Wt 181 lb 9.6 oz (82.4 kg)   SpO2 98%   BMI 26.06 kg/m  Constitutional: Well-developed and well-nourished. No distress. HEENT: Normocephalic and atraumatic.  Conjunctivae are normal.  No scleral icterus. Neck: Neck supple. Trachea midline. Cardiovascular: Normal rate, regular rhythm and  intact distal pulses. No M/R/G Pulmonary/chest: Effort normal and breath sounds normal. No wheezing, rales or rhonchi. Abdominal: Soft, nontender, nondistended. Bowel sounds active throughout. There are no masses palpable. No hepatosplenomegaly. Extremities: no clubbing, cyanosis, or edema Neurological: Alert and oriented to person place and time. Skin: Skin is warm and dry. Psychiatric: Normal mood and affect. Behavior is normal.     Assessment & Plan:  77 year old male with a history of Crohn's colitis (primarily left-sided and diagnosed in 1986), GERD and hypertension plus hyperlipidemia who is here for follow-up to evaluate dysphagia  1.  Esophageal dysphagia --I recommended urgent endoscopy to evaluate his symptoms.  Fortunately he has not had weight loss.  We need to rule out esophagitis and mass lesion.  Will also evaluate for stricture.  No improvement with PPI.  He will be due surveillance colonoscopy soon but given limited availability for procedure slots soon, I prefer proceeding with upper endoscopy given the urgency.  I can perform this later today in the [place].  He had a small bowl of cereal at 730 this morning. --EGD this afternoon at 330; we discussed the risk, benefits and alternatives and he is agreeable and wishes to proceed.  N.p.o. for solids now, clear liquids up until noon  2.  Crohn's colitis --doing very well.  Due for surveillance colonoscopy in February 2020.  For now continue sulfasalazine 1 g twice daily with supplemental folate  1 mg daily.  25 minutes spent with the patient today. Greater than 50% was spent in counseling and coordination of care with the patient

## 2018-01-08 NOTE — Patient Instructions (Addendum)
CT scheduled on December 18th, please arrive at [facility] off [address] by 11:30 AM. Clear liquids only until 8AM, nothing by mouth after 8AM.   Labs today.  YOU HAD AN ENDOSCOPIC PROCEDURE TODAY AT [facility]:   Refer to the procedure report that was given to you for any specific questions about what was found during the examination.  If the procedure report does not answer your questions, please call your gastroenterologist to clarify.  If you requested that your care partner not be given the details of your procedure findings, then the procedure report has been included in a sealed envelope for you to review at your convenience later.  YOU SHOULD EXPECT: Some feelings of bloating in the abdomen. Passage of more gas than usual.  Walking can help get rid of the air that was put into your GI tract during the procedure and reduce the bloating. If you had a lower endoscopy (such as a colonoscopy or flexible sigmoidoscopy) you may notice spotting of blood in your stool or on the toilet paper. If you underwent a bowel prep for your procedure, you may not have a normal bowel movement for a few days.  Please Note:  You might notice some irritation and congestion in your nose or some drainage.  This is from the oxygen used during your procedure.  There is no need for concern and it should clear up in a day or so.  SYMPTOMS TO REPORT IMMEDIATELY:   Following upper endoscopy (EGD)  Vomiting of blood or coffee ground material  New chest pain or pain under the shoulder blades  Painful or persistently difficult swallowing  New shortness of breath  Fever of 100F or higher  Black, tarry-looking stools  For urgent or emergent issues, a gastroenterologist can be reached at any hour by calling [phone].   DIET:  We do recommend a small meal at first, but then you may proceed to your regular diet.  Drink plenty of fluids but you should avoid alcoholic beverages for 24  hours.  ACTIVITY:  You should plan to take it easy for the rest of today and you should NOT DRIVE or use heavy machinery until tomorrow (because of the sedation medicines used during the test).    FOLLOW UP: Our staff will call the number listed on your records the next business day following your procedure to check on you and address any questions or concerns that you may have regarding the information given to you following your procedure. If we do not reach you, we will leave a message.  However, if you are feeling well and you are not experiencing any problems, there is no need to return our call.  We will assume that you have returned to your regular daily activities without incident.  If any biopsies were taken you will be contacted by phone or by letter within the next 1-3 weeks.  Please call us at [phone] if you have not heard about the biopsies in 3 weeks.    SIGNATURES/CONFIDENTIALITY: You and/or your care partner have signed paperwork which will be entered into your electronic medical record.  These signatures attest to the fact that that the information above on your After Visit Summary has been reviewed and is understood.  Full responsibility of the confidentiality of this discharge information lies with you and/or your care-partner.

## 2018-01-08 NOTE — Telephone Encounter (Signed)
Labs entered per Dr. Vena Rua verbal order. Pt scheduled for [facility] [address] 01/10/18@12noon . Pt to arrive there at 11:30am to drink water based contrast. Pt to have no solid food after 8am, may have clear liquids. [place] RN to notify pt of appt.

## 2018-01-08 NOTE — Patient Instructions (Signed)
You have been scheduled for an endoscopy. Please follow written instructions given to you at your visit today. If you use inhalers (even only as needed), please bring them with you on the day of your procedure. Your physician has requested that you go to www.startemmi.com and enter the access code given to you at your visit today. This web site gives a general overview about your procedure. However, you should still follow specific instructions given to you by our office regarding your preparation for the procedure.  If you are age 41 or older, your body mass index should be between 23-30. Your Body mass index is 26.06 kg/m. If this is out of the aforementioned range listed, please consider follow up with your Primary Care Provider.  If you are age 64 or younger, your body mass index should be between 19-25. Your Body mass index is 26.06 kg/m. If this is out of the aformentioned range listed, please consider follow up with your Primary Care Provider.

## 2018-01-08 NOTE — Op Note (Signed)
[facility] Patient Name: Jeffery [name] Procedure Date: 01/08/2018 3:04 PM MRN: [id number] Endoscopist: Jerene Bears , MD Age: 77 Referring MD:  Date of Birth: 1940-03-19 Gender: Male Account #: [id number] Procedure:                Upper GI endoscopy Indications:              Dysphagia (progressive over the last 4-6 months) Medicines:                Monitored Anesthesia Care Procedure:                Pre-Anesthesia Assessment:                           - Prior to the procedure, a History and Physical                            was performed, and patient medications and                            allergies were reviewed. The patient's tolerance of                            previous anesthesia was also reviewed. The risks                            and benefits of the procedure and the sedation                            options and risks were discussed with the patient.                            All questions were answered, and informed consent                            was obtained. Prior Anticoagulants: The patient has                            taken no previous anticoagulant or antiplatelet                            agents. ASA Grade Assessment: II - A patient with                            mild systemic disease. After reviewing the risks                            and benefits, the patient was deemed in                            satisfactory condition to undergo the procedure.                           After obtaining informed consent, the endoscope was  passed under direct vision. Throughout the                            procedure, the patient's blood pressure, pulse, and                            oxygen saturations were monitored continuously. The                            Model GIF-HQ190 [id number]) scope was introduced                            through the mouth, and advanced to the second part                            of  duodenum. The upper GI endoscopy was                            accomplished without difficulty. The patient                            tolerated the procedure well. Scope In: Scope Out: Findings:                 A medium-sized, fungating mass with stigmata of                            recent bleeding was found in the distal esophagus                            and at the gastroesophageal junction. This is                            located between 37 to 40 cm from the incisors. The                            mass was partially obstructing and near                            circumferential at the GE junction. Multiple                            biopsies were obtained with cold forceps for                            histology in a targeted manner.                           The mass is visible with oozing bleeding on                            retroflexed views from the stomach at the  gastroesophageal junction.                           Hematin in the gastric body, lavaged. The exam of                            the stomach was otherwise normal.                           The examined duodenum was normal. Complications:            No immediate complications. Estimated Blood Loss:     Estimated blood loss was minimal. Impression:               - Partially obstructing, malignant esophageal tumor                            was found in the distal esophagus and at the                            gastroesophageal junction.                           - Mass visible in gastric cardia. Stomach otherwise                            normal.                           - Normal examined duodenum.                           - Multiple biopsies were obtained. Recommendation:           - Patient has a contact number available for                            emergencies. The signs and symptoms of potential                            delayed complications were discussed with the                             patient. Return to normal activities tomorrow.                            Written discharge instructions were provided to the                            patient.                           - Soft diet.                           - Continue present medications.                           -  Await pathology results.                           - Perform a CT scan (computed tomography) of chest                            with contrast, abdomen with contrast and pelvis                            with contrast at appointment to be scheduled.                           - CMP, CBC, iron studies today.                           - Oncology and surgical referrals. Jerene Bears, MD 01/08/2018 4:07:59 PM This report has been signed electronically.

## 2018-01-08 NOTE — Progress Notes (Signed)
Alert and oriented x3, pleased with MAC, report to RN  

## 2018-01-09 ENCOUNTER — Telehealth: Payer: Self-pay

## 2018-01-09 ENCOUNTER — Other Ambulatory Visit: Payer: Self-pay

## 2018-01-09 DIAGNOSIS — K2289 Other specified disease of esophagus: Secondary | ICD-10-CM

## 2018-01-09 DIAGNOSIS — K228 Other specified diseases of esophagus: Secondary | ICD-10-CM

## 2018-01-09 NOTE — Telephone Encounter (Signed)
  Follow up Call-  Call back number 01/08/2018  Post procedure Call Back phone  # [phone]  Permission to leave phone message Yes  Some recent data might be hidden     Patient questions:  Do you have a fever, pain , or abdominal swelling? No. Pain Score  0 *  Have you tolerated food without any problems? Yes.    Have you been able to return to your normal activities? Yes.    Do you have any questions about your discharge instructions: Diet   Yes.   Medications  No. Follow up visit  Yes.    Do you have questions or concerns about your Care? No.  Actions: * If pain score is 4 or above: No action needed, pain <4.  Patient states that he is able to eat soft food and questioned how long he needed to stay on a soft diet.  Advised to continue with soft diet to avoid any problems and await results from Dr. Hilarie Fredrickson.  Pt states that he has a CT scan scheduled for tomorrow and is awaiting information about surgical consult appointment.  Advised to call Dr. Vena Rua office this afternoon if he hadn't heard any updated information.  Pt agreed.

## 2018-01-10 ENCOUNTER — Ambulatory Visit (INDEPENDENT_AMBULATORY_CARE_PROVIDER_SITE_OTHER)
Admission: RE | Admit: 2018-01-10 | Discharge: 2018-01-10 | Disposition: A | Discharge status: Home / Self Care | Payer: Medicare Other | Source: Ambulatory Visit | Attending: Internal Medicine | Admitting: Internal Medicine

## 2018-01-10 ENCOUNTER — Telehealth: Payer: Self-pay | Admitting: Oncology

## 2018-01-10 DIAGNOSIS — K2289 Other specified disease of esophagus: Secondary | ICD-10-CM

## 2018-01-10 DIAGNOSIS — K229 Disease of esophagus, unspecified: Secondary | ICD-10-CM | POA: Diagnosis not present

## 2018-01-10 DIAGNOSIS — K228 Other specified diseases of esophagus: Secondary | ICD-10-CM | POA: Diagnosis not present

## 2018-01-10 DIAGNOSIS — K5730 Diverticulosis of large intestine without perforation or abscess without bleeding: Secondary | ICD-10-CM | POA: Diagnosis not present

## 2018-01-10 DIAGNOSIS — R918 Other nonspecific abnormal finding of lung field: Secondary | ICD-10-CM | POA: Diagnosis not present

## 2018-01-10 MED ORDER — IOPAMIDOL (ISOVUE-300) INJECTION 61%
100.0000 mL | Freq: Once | INTRAVENOUS | Status: AC | PRN
  Administered 2018-01-10: 100 mL via INTRAVENOUS

## 2018-01-10 NOTE — Telephone Encounter (Signed)
New referral received from Dr. Hilarie Fredrickson for esophageal mass. Pt has been cld and scheduled to see Dr. Benay Spice on 12/24 at 9am. Pt aware to arrive 30 minutes early.

## 2018-01-11 NOTE — Progress Notes (Signed)
[facility] nurse staff messaged that patient will be treated at [facility]. Consult with Dr. Benay Spice on 01/16/18 cancelled.

## 2018-01-16 ENCOUNTER — Ambulatory Visit: Payer: Medicare Other | Admitting: Oncology

## 2018-01-19 DIAGNOSIS — H2512 Age-related nuclear cataract, left eye: Secondary | ICD-10-CM | POA: Diagnosis not present

## 2018-01-19 DIAGNOSIS — Z961 Presence of intraocular lens: Secondary | ICD-10-CM | POA: Diagnosis not present

## 2018-01-19 DIAGNOSIS — H401130 Primary open-angle glaucoma, bilateral, stage unspecified: Secondary | ICD-10-CM | POA: Diagnosis not present

## 2018-01-25 DIAGNOSIS — C155 Malignant neoplasm of lower third of esophagus: Secondary | ICD-10-CM | POA: Diagnosis not present

## 2018-01-25 DIAGNOSIS — Z87891 Personal history of nicotine dependence: Secondary | ICD-10-CM | POA: Diagnosis not present

## 2018-01-25 DIAGNOSIS — K5090 Crohn's disease, unspecified, without complications: Secondary | ICD-10-CM | POA: Diagnosis not present

## 2018-01-25 DIAGNOSIS — R1310 Dysphagia, unspecified: Secondary | ICD-10-CM | POA: Diagnosis not present

## 2018-01-31 ENCOUNTER — Encounter: Payer: Self-pay | Admitting: Internal Medicine

## 2018-01-31 DIAGNOSIS — Z87891 Personal history of nicotine dependence: Secondary | ICD-10-CM | POA: Diagnosis not present

## 2018-01-31 DIAGNOSIS — C159 Malignant neoplasm of esophagus, unspecified: Secondary | ICD-10-CM | POA: Diagnosis not present

## 2018-01-31 DIAGNOSIS — K219 Gastro-esophageal reflux disease without esophagitis: Secondary | ICD-10-CM | POA: Diagnosis not present

## 2018-01-31 DIAGNOSIS — C160 Malignant neoplasm of cardia: Secondary | ICD-10-CM | POA: Diagnosis not present

## 2018-01-31 DIAGNOSIS — R599 Enlarged lymph nodes, unspecified: Secondary | ICD-10-CM | POA: Diagnosis not present

## 2018-01-31 DIAGNOSIS — K228 Other specified diseases of esophagus: Secondary | ICD-10-CM | POA: Diagnosis not present

## 2018-01-31 DIAGNOSIS — K5090 Crohn's disease, unspecified, without complications: Secondary | ICD-10-CM | POA: Diagnosis not present

## 2018-01-31 DIAGNOSIS — R1310 Dysphagia, unspecified: Secondary | ICD-10-CM | POA: Diagnosis not present

## 2018-01-31 DIAGNOSIS — Z7982 Long term (current) use of aspirin: Secondary | ICD-10-CM | POA: Diagnosis not present

## 2018-01-31 DIAGNOSIS — E7800 Pure hypercholesterolemia, unspecified: Secondary | ICD-10-CM | POA: Diagnosis not present

## 2018-01-31 DIAGNOSIS — K56699 Other intestinal obstruction unspecified as to partial versus complete obstruction: Secondary | ICD-10-CM | POA: Diagnosis not present

## 2018-01-31 DIAGNOSIS — I10 Essential (primary) hypertension: Secondary | ICD-10-CM | POA: Diagnosis not present

## 2018-01-31 DIAGNOSIS — R933 Abnormal findings on diagnostic imaging of other parts of digestive tract: Secondary | ICD-10-CM | POA: Diagnosis not present

## 2018-01-31 DIAGNOSIS — Z79899 Other long term (current) drug therapy: Secondary | ICD-10-CM | POA: Diagnosis not present

## 2018-02-01 DIAGNOSIS — C155 Malignant neoplasm of lower third of esophagus: Secondary | ICD-10-CM | POA: Diagnosis not present

## 2018-02-02 DIAGNOSIS — C155 Malignant neoplasm of lower third of esophagus: Secondary | ICD-10-CM | POA: Diagnosis not present

## 2018-02-07 DIAGNOSIS — C155 Malignant neoplasm of lower third of esophagus: Secondary | ICD-10-CM | POA: Diagnosis not present

## 2018-02-14 DIAGNOSIS — C155 Malignant neoplasm of lower third of esophagus: Secondary | ICD-10-CM | POA: Diagnosis not present

## 2018-02-15 DIAGNOSIS — C155 Malignant neoplasm of lower third of esophagus: Secondary | ICD-10-CM | POA: Diagnosis not present

## 2018-02-16 DIAGNOSIS — C155 Malignant neoplasm of lower third of esophagus: Secondary | ICD-10-CM | POA: Diagnosis not present

## 2018-02-19 DIAGNOSIS — Z5111 Encounter for antineoplastic chemotherapy: Secondary | ICD-10-CM | POA: Diagnosis not present

## 2018-02-19 DIAGNOSIS — C155 Malignant neoplasm of lower third of esophagus: Secondary | ICD-10-CM | POA: Diagnosis not present

## 2018-02-20 DIAGNOSIS — C155 Malignant neoplasm of lower third of esophagus: Secondary | ICD-10-CM | POA: Diagnosis not present

## 2018-02-21 DIAGNOSIS — C155 Malignant neoplasm of lower third of esophagus: Secondary | ICD-10-CM | POA: Diagnosis not present

## 2018-02-22 DIAGNOSIS — Z510 Encounter for antineoplastic radiation therapy: Secondary | ICD-10-CM | POA: Diagnosis not present

## 2018-02-22 DIAGNOSIS — Z79899 Other long term (current) drug therapy: Secondary | ICD-10-CM | POA: Diagnosis not present

## 2018-02-22 DIAGNOSIS — K5900 Constipation, unspecified: Secondary | ICD-10-CM | POA: Diagnosis not present

## 2018-02-22 DIAGNOSIS — C155 Malignant neoplasm of lower third of esophagus: Secondary | ICD-10-CM | POA: Diagnosis not present

## 2018-02-23 DIAGNOSIS — C155 Malignant neoplasm of lower third of esophagus: Secondary | ICD-10-CM | POA: Diagnosis not present

## 2018-02-25 ENCOUNTER — Other Ambulatory Visit: Payer: Self-pay | Admitting: Internal Medicine

## 2018-02-26 ENCOUNTER — Other Ambulatory Visit: Payer: Medicare Other

## 2018-02-26 DIAGNOSIS — R51 Headache: Secondary | ICD-10-CM | POA: Diagnosis not present

## 2018-02-26 DIAGNOSIS — Z87891 Personal history of nicotine dependence: Secondary | ICD-10-CM | POA: Diagnosis not present

## 2018-02-26 DIAGNOSIS — Z5111 Encounter for antineoplastic chemotherapy: Secondary | ICD-10-CM | POA: Diagnosis not present

## 2018-02-26 DIAGNOSIS — Z510 Encounter for antineoplastic radiation therapy: Secondary | ICD-10-CM | POA: Diagnosis not present

## 2018-02-26 DIAGNOSIS — K5900 Constipation, unspecified: Secondary | ICD-10-CM | POA: Diagnosis not present

## 2018-02-26 DIAGNOSIS — C155 Malignant neoplasm of lower third of esophagus: Secondary | ICD-10-CM | POA: Diagnosis not present

## 2018-02-27 DIAGNOSIS — C155 Malignant neoplasm of lower third of esophagus: Secondary | ICD-10-CM | POA: Diagnosis not present

## 2018-02-28 DIAGNOSIS — C155 Malignant neoplasm of lower third of esophagus: Secondary | ICD-10-CM | POA: Diagnosis not present

## 2018-03-01 DIAGNOSIS — C155 Malignant neoplasm of lower third of esophagus: Secondary | ICD-10-CM | POA: Diagnosis not present

## 2018-03-02 DIAGNOSIS — C155 Malignant neoplasm of lower third of esophagus: Secondary | ICD-10-CM | POA: Diagnosis not present

## 2018-03-05 DIAGNOSIS — C155 Malignant neoplasm of lower third of esophagus: Secondary | ICD-10-CM | POA: Diagnosis not present

## 2018-03-05 DIAGNOSIS — R0982 Postnasal drip: Secondary | ICD-10-CM | POA: Diagnosis not present

## 2018-03-05 DIAGNOSIS — Z510 Encounter for antineoplastic radiation therapy: Secondary | ICD-10-CM | POA: Diagnosis not present

## 2018-03-05 DIAGNOSIS — J029 Acute pharyngitis, unspecified: Secondary | ICD-10-CM | POA: Diagnosis not present

## 2018-03-05 DIAGNOSIS — Z87891 Personal history of nicotine dependence: Secondary | ICD-10-CM | POA: Diagnosis not present

## 2018-03-05 DIAGNOSIS — R0989 Other specified symptoms and signs involving the circulatory and respiratory systems: Secondary | ICD-10-CM | POA: Diagnosis not present

## 2018-03-05 DIAGNOSIS — Z5111 Encounter for antineoplastic chemotherapy: Secondary | ICD-10-CM | POA: Diagnosis not present

## 2018-03-05 DIAGNOSIS — R5383 Other fatigue: Secondary | ICD-10-CM | POA: Diagnosis not present

## 2018-03-06 ENCOUNTER — Encounter: Payer: Self-pay | Admitting: Family Medicine

## 2018-03-06 ENCOUNTER — Other Ambulatory Visit: Payer: Self-pay | Admitting: Family Medicine

## 2018-03-06 DIAGNOSIS — C155 Malignant neoplasm of lower third of esophagus: Secondary | ICD-10-CM | POA: Diagnosis not present

## 2018-03-06 MED ORDER — OMEPRAZOLE 40 MG PO CPDR: 40.0000 mg | DELAYED_RELEASE_CAPSULE | Freq: Every day | ORAL | 3 refills | Status: DC

## 2018-03-06 NOTE — Telephone Encounter (Signed)
See office notes and procedure with Dr. Hilarie Fredrickson and confirm refill is okay.

## 2018-03-06 NOTE — Progress Notes (Signed)
Omeprazole refill

## 2018-03-07 DIAGNOSIS — C155 Malignant neoplasm of lower third of esophagus: Secondary | ICD-10-CM | POA: Diagnosis not present

## 2018-03-08 DIAGNOSIS — C155 Malignant neoplasm of lower third of esophagus: Secondary | ICD-10-CM | POA: Diagnosis not present

## 2018-03-09 DIAGNOSIS — C155 Malignant neoplasm of lower third of esophagus: Secondary | ICD-10-CM | POA: Diagnosis not present

## 2018-03-12 DIAGNOSIS — Z5111 Encounter for antineoplastic chemotherapy: Secondary | ICD-10-CM | POA: Diagnosis not present

## 2018-03-12 DIAGNOSIS — I10 Essential (primary) hypertension: Secondary | ICD-10-CM | POA: Diagnosis not present

## 2018-03-12 DIAGNOSIS — J349 Unspecified disorder of nose and nasal sinuses: Secondary | ICD-10-CM | POA: Diagnosis not present

## 2018-03-12 DIAGNOSIS — C160 Malignant neoplasm of cardia: Secondary | ICD-10-CM | POA: Diagnosis not present

## 2018-03-12 DIAGNOSIS — C155 Malignant neoplasm of lower third of esophagus: Secondary | ICD-10-CM | POA: Diagnosis not present

## 2018-03-12 DIAGNOSIS — R5383 Other fatigue: Secondary | ICD-10-CM | POA: Diagnosis not present

## 2018-03-12 DIAGNOSIS — R066 Hiccough: Secondary | ICD-10-CM | POA: Diagnosis not present

## 2018-03-12 DIAGNOSIS — E876 Hypokalemia: Secondary | ICD-10-CM | POA: Diagnosis not present

## 2018-03-12 DIAGNOSIS — Z87891 Personal history of nicotine dependence: Secondary | ICD-10-CM | POA: Diagnosis not present

## 2018-03-12 DIAGNOSIS — R110 Nausea: Secondary | ICD-10-CM | POA: Diagnosis not present

## 2018-03-13 ENCOUNTER — Other Ambulatory Visit: Payer: Self-pay | Admitting: Family Medicine

## 2018-03-13 DIAGNOSIS — C155 Malignant neoplasm of lower third of esophagus: Secondary | ICD-10-CM | POA: Diagnosis not present

## 2018-03-14 DIAGNOSIS — C155 Malignant neoplasm of lower third of esophagus: Secondary | ICD-10-CM | POA: Diagnosis not present

## 2018-03-15 DIAGNOSIS — C155 Malignant neoplasm of lower third of esophagus: Secondary | ICD-10-CM | POA: Diagnosis not present

## 2018-03-16 DIAGNOSIS — C155 Malignant neoplasm of lower third of esophagus: Secondary | ICD-10-CM | POA: Diagnosis not present

## 2018-03-19 DIAGNOSIS — C155 Malignant neoplasm of lower third of esophagus: Secondary | ICD-10-CM | POA: Diagnosis not present

## 2018-03-19 DIAGNOSIS — Z87891 Personal history of nicotine dependence: Secondary | ICD-10-CM | POA: Diagnosis not present

## 2018-03-19 DIAGNOSIS — Z5111 Encounter for antineoplastic chemotherapy: Secondary | ICD-10-CM | POA: Diagnosis not present

## 2018-03-19 DIAGNOSIS — E876 Hypokalemia: Secondary | ICD-10-CM | POA: Diagnosis not present

## 2018-03-19 DIAGNOSIS — R5383 Other fatigue: Secondary | ICD-10-CM | POA: Diagnosis not present

## 2018-03-19 DIAGNOSIS — Z510 Encounter for antineoplastic radiation therapy: Secondary | ICD-10-CM | POA: Diagnosis not present

## 2018-03-20 DIAGNOSIS — C155 Malignant neoplasm of lower third of esophagus: Secondary | ICD-10-CM | POA: Diagnosis not present

## 2018-04-25 DIAGNOSIS — C155 Malignant neoplasm of lower third of esophagus: Secondary | ICD-10-CM | POA: Diagnosis not present

## 2018-04-26 DIAGNOSIS — Z87891 Personal history of nicotine dependence: Secondary | ICD-10-CM | POA: Diagnosis not present

## 2018-04-26 DIAGNOSIS — C155 Malignant neoplasm of lower third of esophagus: Secondary | ICD-10-CM | POA: Diagnosis not present

## 2018-04-26 DIAGNOSIS — R0602 Shortness of breath: Secondary | ICD-10-CM | POA: Diagnosis not present

## 2018-05-11 ENCOUNTER — Other Ambulatory Visit: Payer: Self-pay | Admitting: Family Medicine

## 2018-05-11 ENCOUNTER — Other Ambulatory Visit: Payer: Self-pay | Admitting: Internal Medicine

## 2018-05-23 DIAGNOSIS — Z01818 Encounter for other preprocedural examination: Secondary | ICD-10-CM | POA: Diagnosis not present

## 2018-05-23 DIAGNOSIS — J90 Pleural effusion, not elsewhere classified: Secondary | ICD-10-CM | POA: Diagnosis not present

## 2018-05-23 DIAGNOSIS — H401121 Primary open-angle glaucoma, left eye, mild stage: Secondary | ICD-10-CM | POA: Diagnosis not present

## 2018-05-23 DIAGNOSIS — M1990 Unspecified osteoarthritis, unspecified site: Secondary | ICD-10-CM | POA: Diagnosis not present

## 2018-05-23 DIAGNOSIS — K50913 Crohn's disease, unspecified, with fistula: Secondary | ICD-10-CM | POA: Diagnosis not present

## 2018-05-23 DIAGNOSIS — R1312 Dysphagia, oropharyngeal phase: Secondary | ICD-10-CM | POA: Diagnosis not present

## 2018-05-23 DIAGNOSIS — C159 Malignant neoplasm of esophagus, unspecified: Secondary | ICD-10-CM | POA: Diagnosis not present

## 2018-05-23 DIAGNOSIS — C155 Malignant neoplasm of lower third of esophagus: Secondary | ICD-10-CM | POA: Diagnosis not present

## 2018-05-23 DIAGNOSIS — I2510 Atherosclerotic heart disease of native coronary artery without angina pectoris: Secondary | ICD-10-CM | POA: Diagnosis not present

## 2018-05-23 DIAGNOSIS — Z9221 Personal history of antineoplastic chemotherapy: Secondary | ICD-10-CM | POA: Diagnosis not present

## 2018-05-23 DIAGNOSIS — H401112 Primary open-angle glaucoma, right eye, moderate stage: Secondary | ICD-10-CM | POA: Diagnosis not present

## 2018-05-23 DIAGNOSIS — K219 Gastro-esophageal reflux disease without esophagitis: Secondary | ICD-10-CM | POA: Diagnosis not present

## 2018-05-23 DIAGNOSIS — J982 Interstitial emphysema: Secondary | ICD-10-CM | POA: Diagnosis not present

## 2018-05-23 DIAGNOSIS — J9811 Atelectasis: Secondary | ICD-10-CM | POA: Diagnosis not present

## 2018-05-23 DIAGNOSIS — Z79899 Other long term (current) drug therapy: Secondary | ICD-10-CM | POA: Diagnosis not present

## 2018-05-23 DIAGNOSIS — I10 Essential (primary) hypertension: Secondary | ICD-10-CM | POA: Diagnosis not present

## 2018-05-23 DIAGNOSIS — H9190 Unspecified hearing loss, unspecified ear: Secondary | ICD-10-CM | POA: Diagnosis not present

## 2018-05-23 DIAGNOSIS — Z87891 Personal history of nicotine dependence: Secondary | ICD-10-CM | POA: Diagnosis not present

## 2018-05-23 DIAGNOSIS — E7800 Pure hypercholesterolemia, unspecified: Secondary | ICD-10-CM | POA: Diagnosis not present

## 2018-05-23 DIAGNOSIS — Z1159 Encounter for screening for other viral diseases: Secondary | ICD-10-CM | POA: Diagnosis not present

## 2018-05-23 DIAGNOSIS — Z9049 Acquired absence of other specified parts of digestive tract: Secondary | ICD-10-CM | POA: Diagnosis not present

## 2018-05-23 DIAGNOSIS — I471 Supraventricular tachycardia: Secondary | ICD-10-CM | POA: Diagnosis not present

## 2018-05-23 DIAGNOSIS — J948 Other specified pleural conditions: Secondary | ICD-10-CM | POA: Diagnosis not present

## 2018-05-23 DIAGNOSIS — Z888 Allergy status to other drugs, medicaments and biological substances status: Secondary | ICD-10-CM | POA: Diagnosis not present

## 2018-05-23 DIAGNOSIS — C160 Malignant neoplasm of cardia: Secondary | ICD-10-CM | POA: Diagnosis not present

## 2018-05-23 DIAGNOSIS — Z923 Personal history of irradiation: Secondary | ICD-10-CM | POA: Diagnosis not present

## 2018-05-23 DIAGNOSIS — J939 Pneumothorax, unspecified: Secondary | ICD-10-CM | POA: Diagnosis not present

## 2018-05-23 DIAGNOSIS — R918 Other nonspecific abnormal finding of lung field: Secondary | ICD-10-CM | POA: Diagnosis not present

## 2018-05-23 DIAGNOSIS — D630 Anemia in neoplastic disease: Secondary | ICD-10-CM | POA: Diagnosis not present

## 2018-05-23 DIAGNOSIS — G8918 Other acute postprocedural pain: Secondary | ICD-10-CM | POA: Diagnosis not present

## 2018-05-24 DIAGNOSIS — C155 Malignant neoplasm of lower third of esophagus: Secondary | ICD-10-CM | POA: Diagnosis not present

## 2018-05-25 DIAGNOSIS — C159 Malignant neoplasm of esophagus, unspecified: Secondary | ICD-10-CM | POA: Diagnosis not present

## 2018-05-25 DIAGNOSIS — C155 Malignant neoplasm of lower third of esophagus: Secondary | ICD-10-CM | POA: Diagnosis not present

## 2018-06-04 DIAGNOSIS — C159 Malignant neoplasm of esophagus, unspecified: Secondary | ICD-10-CM | POA: Diagnosis not present

## 2018-06-04 DIAGNOSIS — R1312 Dysphagia, oropharyngeal phase: Secondary | ICD-10-CM | POA: Diagnosis not present

## 2018-06-04 MED ORDER — OXYCODONE HCL 5 MG/5ML PO SOLN
5.00 | ORAL | Status: DC
Start: ? — End: 2018-06-04

## 2018-06-04 MED ORDER — GENERIC EXTERNAL MEDICATION
20.00 | Status: DC
Start: 2018-06-05 — End: 2018-06-04

## 2018-06-04 MED ORDER — ALBUTEROL SULFATE 2.5 MG/0.5ML IN NEBU
2.50 | INHALATION_SOLUTION | RESPIRATORY_TRACT | Status: DC
Start: 2018-06-04 — End: 2018-06-04

## 2018-06-04 MED ORDER — DEXTROSE 50 % IV SOLN
12.50 | INTRAVENOUS | Status: DC
Start: ? — End: 2018-06-04

## 2018-06-04 MED ORDER — GLUCAGON HCL RDNA (DIAGNOSTIC) 1 MG IJ SOLR
1.00 | INTRAMUSCULAR | Status: DC
Start: ? — End: 2018-06-04

## 2018-06-04 MED ORDER — HEPARIN SODIUM (PORCINE) 5000 UNIT/ML IJ SOLN
5000.00 | INTRAMUSCULAR | Status: DC
Start: 2018-06-04 — End: 2018-06-04

## 2018-06-04 MED ORDER — SODIUM CHLORIDE 3 % IN NEBU
4.00 | INHALATION_SOLUTION | RESPIRATORY_TRACT | Status: DC
Start: 2018-06-04 — End: 2018-06-04

## 2018-06-04 MED ORDER — LOSARTAN POTASSIUM 50 MG PO TABS
100.00 | ORAL_TABLET | ORAL | Status: DC
Start: 2018-06-05 — End: 2018-06-04

## 2018-06-04 MED ORDER — BISACODYL 10 MG RE SUPP
10.00 | RECTAL | Status: DC
Start: 2018-06-05 — End: 2018-06-04

## 2018-06-04 MED ORDER — LIDOCAINE HCL 1 % IJ SOLN
.50 | INTRAMUSCULAR | Status: DC
Start: ? — End: 2018-06-04

## 2018-06-04 MED ORDER — DEXTROSE 10 % IV SOLN
50.00 | INTRAVENOUS | Status: DC
Start: ? — End: 2018-06-04

## 2018-06-04 MED ORDER — ONDANSETRON HCL 4 MG/2ML IJ SOLN
4.00 | INTRAMUSCULAR | Status: DC
Start: ? — End: 2018-06-04

## 2018-06-04 MED ORDER — AMLODIPINE BESYLATE 5 MG PO TABS
5.00 | ORAL_TABLET | ORAL | Status: DC
Start: 2018-06-04 — End: 2018-06-04

## 2018-06-04 MED ORDER — GENERIC EXTERNAL MEDICATION
1.00 | Status: DC
Start: 2018-06-05 — End: 2018-06-04

## 2018-06-11 ENCOUNTER — Other Ambulatory Visit: Payer: Self-pay | Admitting: Cardiovascular Disease

## 2018-06-12 DIAGNOSIS — C159 Malignant neoplasm of esophagus, unspecified: Secondary | ICD-10-CM | POA: Diagnosis not present

## 2018-06-12 DIAGNOSIS — R1312 Dysphagia, oropharyngeal phase: Secondary | ICD-10-CM | POA: Diagnosis not present

## 2018-06-18 DIAGNOSIS — C159 Malignant neoplasm of esophagus, unspecified: Secondary | ICD-10-CM | POA: Diagnosis not present

## 2018-06-18 DIAGNOSIS — R1312 Dysphagia, oropharyngeal phase: Secondary | ICD-10-CM | POA: Diagnosis not present

## 2018-06-19 DIAGNOSIS — Z87891 Personal history of nicotine dependence: Secondary | ICD-10-CM | POA: Diagnosis not present

## 2018-06-19 DIAGNOSIS — Z9889 Other specified postprocedural states: Secondary | ICD-10-CM | POA: Diagnosis not present

## 2018-06-19 DIAGNOSIS — C155 Malignant neoplasm of lower third of esophagus: Secondary | ICD-10-CM | POA: Diagnosis not present

## 2018-06-19 DIAGNOSIS — R918 Other nonspecific abnormal finding of lung field: Secondary | ICD-10-CM | POA: Diagnosis not present

## 2018-06-19 DIAGNOSIS — R05 Cough: Secondary | ICD-10-CM | POA: Diagnosis not present

## 2018-06-24 DIAGNOSIS — R1312 Dysphagia, oropharyngeal phase: Secondary | ICD-10-CM | POA: Diagnosis not present

## 2018-06-24 DIAGNOSIS — C159 Malignant neoplasm of esophagus, unspecified: Secondary | ICD-10-CM | POA: Diagnosis not present

## 2018-06-30 DIAGNOSIS — R1312 Dysphagia, oropharyngeal phase: Secondary | ICD-10-CM | POA: Diagnosis not present

## 2018-06-30 DIAGNOSIS — C159 Malignant neoplasm of esophagus, unspecified: Secondary | ICD-10-CM | POA: Diagnosis not present

## 2018-07-03 DIAGNOSIS — C155 Malignant neoplasm of lower third of esophagus: Secondary | ICD-10-CM | POA: Diagnosis not present

## 2018-07-11 DIAGNOSIS — Z1159 Encounter for screening for other viral diseases: Secondary | ICD-10-CM | POA: Diagnosis not present

## 2018-07-11 DIAGNOSIS — C155 Malignant neoplasm of lower third of esophagus: Secondary | ICD-10-CM | POA: Diagnosis not present

## 2018-07-11 DIAGNOSIS — Z01818 Encounter for other preprocedural examination: Secondary | ICD-10-CM | POA: Diagnosis not present

## 2018-07-13 DIAGNOSIS — Z87891 Personal history of nicotine dependence: Secondary | ICD-10-CM | POA: Diagnosis not present

## 2018-07-13 DIAGNOSIS — I10 Essential (primary) hypertension: Secondary | ICD-10-CM | POA: Diagnosis not present

## 2018-07-13 DIAGNOSIS — K50913 Crohn's disease, unspecified, with fistula: Secondary | ICD-10-CM | POA: Diagnosis not present

## 2018-07-13 DIAGNOSIS — Z9049 Acquired absence of other specified parts of digestive tract: Secondary | ICD-10-CM | POA: Diagnosis not present

## 2018-07-13 DIAGNOSIS — Z923 Personal history of irradiation: Secondary | ICD-10-CM | POA: Diagnosis not present

## 2018-07-13 DIAGNOSIS — Z9221 Personal history of antineoplastic chemotherapy: Secondary | ICD-10-CM | POA: Diagnosis not present

## 2018-07-13 DIAGNOSIS — K222 Esophageal obstruction: Secondary | ICD-10-CM | POA: Diagnosis not present

## 2018-07-13 DIAGNOSIS — I2510 Atherosclerotic heart disease of native coronary artery without angina pectoris: Secondary | ICD-10-CM | POA: Diagnosis not present

## 2018-07-13 DIAGNOSIS — Z8501 Personal history of malignant neoplasm of esophagus: Secondary | ICD-10-CM | POA: Diagnosis not present

## 2018-07-13 DIAGNOSIS — E7800 Pure hypercholesterolemia, unspecified: Secondary | ICD-10-CM | POA: Diagnosis not present

## 2018-07-18 ENCOUNTER — Telehealth: Payer: Self-pay | Admitting: Family Medicine

## 2018-07-18 NOTE — Telephone Encounter (Signed)
Please schedule [employer] Wellness with fasting labs piror with Dr. Lorelei Pont in August.

## 2018-07-19 NOTE — Telephone Encounter (Signed)
Labs 8/28 cpx 9/2 Pt aware

## 2018-07-31 DIAGNOSIS — H903 Sensorineural hearing loss, bilateral: Secondary | ICD-10-CM | POA: Diagnosis not present

## 2018-07-31 DIAGNOSIS — H6123 Impacted cerumen, bilateral: Secondary | ICD-10-CM | POA: Diagnosis not present

## 2018-08-04 DIAGNOSIS — C155 Malignant neoplasm of lower third of esophagus: Secondary | ICD-10-CM | POA: Diagnosis not present

## 2018-08-04 DIAGNOSIS — Z1159 Encounter for screening for other viral diseases: Secondary | ICD-10-CM | POA: Diagnosis not present

## 2018-08-04 DIAGNOSIS — Z01818 Encounter for other preprocedural examination: Secondary | ICD-10-CM | POA: Diagnosis not present

## 2018-08-06 DIAGNOSIS — K5090 Crohn's disease, unspecified, without complications: Secondary | ICD-10-CM | POA: Diagnosis not present

## 2018-08-06 DIAGNOSIS — Z87891 Personal history of nicotine dependence: Secondary | ICD-10-CM | POA: Diagnosis not present

## 2018-08-06 DIAGNOSIS — Z9221 Personal history of antineoplastic chemotherapy: Secondary | ICD-10-CM | POA: Diagnosis not present

## 2018-08-06 DIAGNOSIS — Z79899 Other long term (current) drug therapy: Secondary | ICD-10-CM | POA: Diagnosis not present

## 2018-08-06 DIAGNOSIS — K222 Esophageal obstruction: Secondary | ICD-10-CM | POA: Diagnosis not present

## 2018-08-06 DIAGNOSIS — I2510 Atherosclerotic heart disease of native coronary artery without angina pectoris: Secondary | ICD-10-CM | POA: Diagnosis not present

## 2018-08-06 DIAGNOSIS — K219 Gastro-esophageal reflux disease without esophagitis: Secondary | ICD-10-CM | POA: Diagnosis not present

## 2018-08-06 DIAGNOSIS — Z923 Personal history of irradiation: Secondary | ICD-10-CM | POA: Diagnosis not present

## 2018-08-06 DIAGNOSIS — Z8501 Personal history of malignant neoplasm of esophagus: Secondary | ICD-10-CM | POA: Diagnosis not present

## 2018-08-06 DIAGNOSIS — Z9049 Acquired absence of other specified parts of digestive tract: Secondary | ICD-10-CM | POA: Diagnosis not present

## 2018-08-06 DIAGNOSIS — C159 Malignant neoplasm of esophagus, unspecified: Secondary | ICD-10-CM | POA: Diagnosis not present

## 2018-08-06 DIAGNOSIS — E7800 Pure hypercholesterolemia, unspecified: Secondary | ICD-10-CM | POA: Diagnosis not present

## 2018-08-06 DIAGNOSIS — I10 Essential (primary) hypertension: Secondary | ICD-10-CM | POA: Diagnosis not present

## 2018-08-13 ENCOUNTER — Other Ambulatory Visit: Payer: Self-pay

## 2018-08-13 NOTE — Patient Outreach (Signed)
[facility]) Care Management  08/13/2018  Jeffery [name] 1940/04/01 [id number]   Medication Adherence call to Jeffery [name] [facility] Verify spoke with patient he is past due on Atorvastatin 40 mg patient explain doctor has stop all his medication because blood pressure been too low patient has an appointment with his primary doctor in a couple of weeks to see if he will continued with atorvastatin. Jeffery [name] is showing past due under [place].  [place] Management Direct Dial [phone]  Fax [phone] [email or web address]

## 2018-08-30 DIAGNOSIS — Z87891 Personal history of nicotine dependence: Secondary | ICD-10-CM | POA: Diagnosis not present

## 2018-08-30 DIAGNOSIS — C155 Malignant neoplasm of lower third of esophagus: Secondary | ICD-10-CM | POA: Diagnosis not present

## 2018-08-30 DIAGNOSIS — Z9049 Acquired absence of other specified parts of digestive tract: Secondary | ICD-10-CM | POA: Diagnosis not present

## 2018-08-31 DIAGNOSIS — C155 Malignant neoplasm of lower third of esophagus: Secondary | ICD-10-CM | POA: Diagnosis not present

## 2018-08-31 DIAGNOSIS — Z01812 Encounter for preprocedural laboratory examination: Secondary | ICD-10-CM | POA: Diagnosis not present

## 2018-09-03 DIAGNOSIS — E7800 Pure hypercholesterolemia, unspecified: Secondary | ICD-10-CM | POA: Diagnosis not present

## 2018-09-03 DIAGNOSIS — Z923 Personal history of irradiation: Secondary | ICD-10-CM | POA: Diagnosis not present

## 2018-09-03 DIAGNOSIS — K219 Gastro-esophageal reflux disease without esophagitis: Secondary | ICD-10-CM | POA: Diagnosis not present

## 2018-09-03 DIAGNOSIS — Z8501 Personal history of malignant neoplasm of esophagus: Secondary | ICD-10-CM | POA: Diagnosis not present

## 2018-09-03 DIAGNOSIS — K5090 Crohn's disease, unspecified, without complications: Secondary | ICD-10-CM | POA: Diagnosis not present

## 2018-09-03 DIAGNOSIS — I10 Essential (primary) hypertension: Secondary | ICD-10-CM | POA: Diagnosis not present

## 2018-09-03 DIAGNOSIS — H2512 Age-related nuclear cataract, left eye: Secondary | ICD-10-CM | POA: Diagnosis not present

## 2018-09-03 DIAGNOSIS — H401112 Primary open-angle glaucoma, right eye, moderate stage: Secondary | ICD-10-CM | POA: Diagnosis not present

## 2018-09-03 DIAGNOSIS — D649 Anemia, unspecified: Secondary | ICD-10-CM | POA: Diagnosis not present

## 2018-09-03 DIAGNOSIS — Z9221 Personal history of antineoplastic chemotherapy: Secondary | ICD-10-CM | POA: Diagnosis not present

## 2018-09-03 DIAGNOSIS — Z79899 Other long term (current) drug therapy: Secondary | ICD-10-CM | POA: Diagnosis not present

## 2018-09-03 DIAGNOSIS — Z9049 Acquired absence of other specified parts of digestive tract: Secondary | ICD-10-CM | POA: Diagnosis not present

## 2018-09-03 DIAGNOSIS — M1990 Unspecified osteoarthritis, unspecified site: Secondary | ICD-10-CM | POA: Diagnosis not present

## 2018-09-03 DIAGNOSIS — C155 Malignant neoplasm of lower third of esophagus: Secondary | ICD-10-CM | POA: Diagnosis not present

## 2018-09-03 DIAGNOSIS — Z87891 Personal history of nicotine dependence: Secondary | ICD-10-CM | POA: Diagnosis not present

## 2018-09-03 DIAGNOSIS — K222 Esophageal obstruction: Secondary | ICD-10-CM | POA: Diagnosis not present

## 2018-09-03 DIAGNOSIS — I2510 Atherosclerotic heart disease of native coronary artery without angina pectoris: Secondary | ICD-10-CM | POA: Diagnosis not present

## 2018-09-03 DIAGNOSIS — H401121 Primary open-angle glaucoma, left eye, mild stage: Secondary | ICD-10-CM | POA: Diagnosis not present

## 2018-09-21 ENCOUNTER — Other Ambulatory Visit: Payer: Self-pay

## 2018-09-21 ENCOUNTER — Other Ambulatory Visit: Payer: Medicare Other

## 2018-09-21 NOTE — Addendum Note (Signed)
Addended by: Cloyd Stagers on: 09/21/2018 12:56 PM   Modules accepted: Orders

## 2018-09-26 ENCOUNTER — Ambulatory Visit (INDEPENDENT_AMBULATORY_CARE_PROVIDER_SITE_OTHER): Payer: Medicare Other | Admitting: Family Medicine

## 2018-09-26 ENCOUNTER — Encounter: Payer: Self-pay | Admitting: Family Medicine

## 2018-09-26 ENCOUNTER — Other Ambulatory Visit: Payer: Self-pay

## 2018-09-26 VITALS — BP 162/90 | HR 58 | Temp 98.2°F | Ht 69.5 in | Wt 155.5 lb

## 2018-09-26 DIAGNOSIS — Z0000 Encounter for general adult medical examination without abnormal findings: Secondary | ICD-10-CM | POA: Diagnosis not present

## 2018-09-26 DIAGNOSIS — Z23 Encounter for immunization: Secondary | ICD-10-CM | POA: Diagnosis not present

## 2018-09-26 NOTE — Progress Notes (Signed)
Camala Talwar T. Zacharee Gaddie, MD Primary Care and [place] at [facility] [place], [address] Phone: [phone]  FAX: [phone]  Jeffery [name] - 78 y.o. male  MRN [id number]  Date of Birth: [birth year]  Visit Date: 09/26/2018  PCP: Owens Loffler, MD  Referred by: Owens Loffler, MD  Chief Complaint  Patient presents with  . Medicare Wellness   Patient Care Team: Owens Loffler, MD as PCP - General Burnell Blanks, MD as PCP - Cardiology (Cardiology) Evangeline Gula, MD as Consulting Physician (Ophthalmology) Burnell Blanks, MD as Consulting Physician (Cardiology) Pyrtle, Lajuan Lines, MD as Consulting Physician (Gastroenterology) Subjective:   Jeffery [name] is a 78 y.o. pleasant patient who presents for a medicare wellness examination:  Preventative Health Maintenance Visit:  Health Maintenance Summary Reviewed and updated, unless pt declines services.  Tobacco History Reviewed. Alcohol: No concerns, no excessive use Exercise Habits: Some activity, rec at least 30 mins 5 times a week STD concerns: no risk or activity to increase risk Drug Use: None Encouraged self-testicular check  Cancer treatment at [facility], esophageal cancer. Rads and chemo, Surgery Walking three times a day  Dr. Hilarie Fredrickson - will stretch esophagus Chrohns follows  CAD with f/u Dr. Yong Channel  BP, a little up in the offce.   Health Maintenance  Topic Date Due  . COLONOSCOPY  03/03/2018  . INFLUENZA VACCINE  08/25/2018  . TETANUS/TDAP  08/03/2022  . PNA vac Low Risk Adult  Completed    Immunization History  Administered Date(s) Administered  . Influenza Split 11/10/2010, 10/03/2011  . Influenza Whole 12/29/2008  . Influenza, High Dose Seasonal PF 11/10/2017  . Influenza,inj,Quad PF,6+ Mos 11/20/2012, 12/25/2013, 11/27/2014, 10/20/2015, 11/03/2016  . Influenza-Unspecified 11/07/2013  . Pneumococcal Conjugate-13  08/07/2013  . Pneumococcal Polysaccharide-23 08/07/2008  . Tdap 08/02/2012  . Zoster 08/07/2008    Patient Active Problem List   Diagnosis Date Noted  . Dysphagia 11/24/2017  . Age-related nuclear cataract of left eye 05/03/2016  . Primary open angle glaucoma of right eye, moderate stage 05/03/2016  . OTHER SPECIFIED FORMS OF HEARING LOSS 08/10/2009  . HYPERCHOLESTEROLEMIA  IIA 11/25/2008  . BENIGN PROSTATIC HYPERTROPHY, HX OF, S/P TURP 08/07/2008  . INTERNAL HEMORRHOIDS 05/14/2008  . ANAL FISTULA 05/14/2008  . COLONIC POLYPS, HYPERPLASTIC, HX OF 05/14/2008  . History of esophagitis 05/14/2008  . HYPERTENSION, BENIGN 05/06/2008  . CAD 05/06/2008  . Crohn's disease ([place]) 05/06/2008   Past Medical History:  Diagnosis Date  . Allergy   . Anal fistula   . CAD (coronary artery disease)   . Cataract   . Crohn disease ([place])   . Esophageal motility disorder   . GERD (gastroesophageal reflux disease)   . Hearing loss   . History of BPH   . History of esophagitis   . Hyperlipidemia   . Hyperplastic colon polyp   . Hypertension   . Internal hemorrhoids   . Right inguinal hernia    Past Surgical History:  Procedure Laterality Date  . COLONOSCOPY    . EYE SURGERY     right eye  . HERNIA REPAIR     x 2  . LIPOMA EXCISION     chest  . PROSTATE SURGERY     x 2, TURP  . UPPER GASTROINTESTINAL ENDOSCOPY     Social History   Socioeconomic History  . Marital status: Married    Spouse name: Not on file  . Number of children:  2  . Years of education: Not on file  . Highest education level: Not on file  Occupational History  . Occupation: Restaurant manager, fast food: RETIRED  Social Needs  . Financial resource strain: Not on file  . Food insecurity    Worry: Not on file    Inability: Not on file  . Transportation needs    Medical: Not on file    Non-medical: Not on file  Tobacco Use  . Smoking status: Former Smoker    Packs/day: 1.00    Years: 20.00     Pack years: 20.00    Types: Cigarettes    Quit date: 01/24/1978    Years since quitting: 40.6  . Smokeless tobacco: Never Used  Substance and Sexual Activity  . Alcohol use: Yes    Alcohol/week: 1.0 - 4.0 standard drinks    Types: 1 Standard drinks or equivalent per week    Comment: occasional wine or beer weekly  . Drug use: No  . Sexual activity: Never  Lifestyle  . Physical activity    Days per week: Not on file    Minutes per session: Not on file  . Stress: Not on file  Relationships  . Social Herbalist on phone: Not on file    Gets together: Not on file    Attends religious service: Not on file    Active member of club or organization: Not on file    Attends meetings of clubs or organizations: Not on file    Relationship status: Not on file  . Intimate partner violence    Fear of current or ex partner: Not on file    Emotionally abused: Not on file    Physically abused: Not on file    Forced sexual activity: Not on file  Other Topics Concern  . Not on file  Social History Narrative   Daily caffeine   Family History  Problem Relation Age of Onset  . Colon polyps Mother   . Heart attack Father 30  . Colon cancer Neg Hx   . Esophageal cancer Neg Hx   . Rectal cancer Neg Hx   . Stomach cancer Neg Hx    Allergies  Allergen Reactions  . Ace Inhibitors Other (See Comments)    REACTION: cough    Medication list has been reviewed and updated.   General: Denies fever, chills, sweats. No significant weight loss. Eyes: Denies blurring,significant itching ENT: Denies earache, sore throat, and hoarseness. Cardiovascular: Denies chest pains, palpitations, dyspnea on exertion Respiratory: Denies cough, dyspnea at rest,wheeezing Breast: no concerns about lumps GI: Denies nausea, vomiting, diarrhea, constipation, change in bowel habits, abdominal pain, melena, hematochezia GU: Denies penile discharge, ED, urinary flow / outflow problems. No STD concerns.  Musculoskeletal: Denies back pain, joint pain Derm: Denies rash, itching Neuro: Denies  paresthesias, frequent falls, frequent headaches Psych: Denies depression, anxiety Endocrine: Denies cold intolerance, heat intolerance, polydipsia Heme: Denies enlarged lymph nodes Allergy: No hayfever  Objective:   BP (!) 162/90   Pulse (!) 58   Temp 98.2 F (36.8 C) (Temporal)   Ht 5' 9.5" (1.765 m)   Wt 155 lb 8 oz (70.5 kg)   SpO2 98%   BMI 22.63 kg/m  Fall Risk  09/26/2018 09/07/2017 08/15/2016 08/10/2015 08/20/2014  Falls in the past year? 0 Yes No No No  Number falls in past yr: - 1 - - -  Injury with Fall? - No - - -  Ideal Body Weight: Weight in (lb) to have BMI = 25: 171.4  Hearing Screening   _0  _1  _2  _3  _4  _5  _6  _7  _8   Right ear:           Left ear:           Comments: Wears bilateral hearing aides.  Vision Screening Comments: Wears Glasses-Has eye exam scheduled 10/24/2018 with Loma Boston at [facility]. Depression screen [facility] 2/9 09/26/2018 09/07/2017 08/15/2016 08/10/2015 08/20/2014  Decreased Interest 0 0 0 0 0  Down, Depressed, Hopeless - 0 0 0 0  PHQ - 2 Score 0 0 0 0 0     GEN: well developed, well nourished, no acute distress Eyes: conjunctiva and lids normal, PERRLA, EOMI ENT: TM clear, nares clear, oral exam WNL Neck: supple, no lymphadenopathy, no thyromegaly, no JVD Pulm: clear to auscultation and percussion, respiratory effort normal CV: regular rate and rhythm, S1-S2, no murmur, rub or gallop, no bruits, peripheral pulses normal and symmetric, no cyanosis, clubbing, edema or varicosities GI: soft, non-tender; no hepatosplenomegaly, masses; active bowel sounds all quadrants GU: no hernia, testicular mass, penile discharge Lymph: no cervical, axillary or inguinal adenopathy MSK: gait normal, muscle tone and strength WNL, no joint swelling, effusions, discoloration, crepitus  SKIN: clear, good turgor, color WNL, no rashes, lesions, or  ulcerations Neuro: normal mental status, normal strength, sensation, and motion Psych: alert; oriented to person, place and time, normally interactive and not anxious or depressed in appearance.  All labs reviewed with patient. Results for orders placed or performed in visit on 01/08/18  IBC panel  Result Value Ref Range   Iron 86 42 - 165 ug/dL   Transferrin 221.0 212.0 - 360.0 mg/dL   Saturation Ratios 27.8 20.0 - 50.0 %  Ferritin  Result Value Ref Range   Ferritin 25.1 22.0 - 322.0 ng/mL  Comp Met (CMET)  Result Value Ref Range   Sodium 137 135 - 145 mEq/L   Potassium 3.6 3.5 - 5.1 mEq/L   Chloride 102 96 - 112 mEq/L   CO2 27 19 - 32 mEq/L   Glucose, Bld 86 70 - 99 mg/dL   BUN 12 6 - 23 mg/dL   Creatinine, Ser 0.85 0.40 - 1.50 mg/dL   Total Bilirubin 0.9 0.2 - 1.2 mg/dL   Alkaline Phosphatase 47 39 - 117 U/L   AST 18 0 - 37 U/L   ALT 15 0 - 53 U/L   Total Protein 6.4 6.0 - 8.3 g/dL   Albumin 4.2 3.5 - 5.2 g/dL   Calcium 9.1 8.4 - 10.5 mg/dL   GFR 92.88 >60.00 mL/min  CBC w/Diff  Result Value Ref Range   WBC 4.9 4.0 - 10.5 K/uL   RBC 4.27 4.22 - 5.81 Mil/uL   Hemoglobin 12.5 (L) 13.0 - 17.0 g/dL   HCT 37.4 (L) 39.0 - 52.0 %   MCV 87.6 78.0 - 100.0 fl   MCHC 33.4 30.0 - 36.0 g/dL   RDW 12.9 11.5 - 15.5 %   Platelets 239.0 150.0 - 400.0 K/uL   Neutrophils Relative % 59.4 43.0 - 77.0 %   Lymphocytes Relative 26.8 12.0 - 46.0 %   Monocytes Relative 11.7 3.0 - 12.0 %   Eosinophils Relative 1.1 0.0 - 5.0 %   Basophils Relative 1.0 0.0 - 3.0 %   Neutro Abs 2.9 1.4 - 7.7 K/uL   Lymphs Abs 1.3 0.7 - 4.0 K/uL   Monocytes Absolute 0.6 0.1 - 1.0 K/uL   Eosinophils Absolute  0.1 0.0 - 0.7 K/uL   Basophils Absolute 0.1 0.0 - 0.1 K/uL   Patient Instructions   WBC (White Blood Cell Count)  Hemoglobin  Hematocrit  Platelets  MCV (Mean Corpuscular Volume)  MCH (Mean Corpuscular Hemoglobin)  MCHC (Mean Corpuscular Hemoglobin Concentration)  RBC (Red Blood Cell Count)   RDW-CV (Red Cell Distribution Width)  NRBC (Nucleated Red Blood Cell Count)  NRBC % (Nucleated Red Blood Cell %)  MPV (Mean Platelet Volume)  Immature Granulocyte Count  Immature Granulocyte %  Immature Platelet Fraction   .0 6.0 7.8 14.0 (H) 10.7 (H) 9.4 8.3 5.9 7.2 7.7 11.6 (H) 13.8 (H) 11.5 (H) 10.9 (H) 5.3 3.9 3.1 (L) 4.8 4.1 6.1 4.7  11.9 (L) 11.5 (L) 10.7 (L) 9.1 (L) 9.4 (L) 9.5 (L) 9.4 (L) 9.4 (L) 9.3 (L) 8.9 (L) 9.6 (L) 10.5 (L) 11.4 (L) 9.9 (L) 12.4 (L) 10.8 (L) 11.1 (L) 11.2 (L) 11.1 (L) 12.1 (L) 12.4 (L)  39.6 37.2 (L) 35.0 (L) 28.7 (L) 29.4 (L) 29.4 (L) 29.4 (L) 29.5 (L) 30.6 (L) 29.2 (L) 30.6 (L) 33.5 (L) 37.3 (L) 30.8 (L) 39.6 33.5 (L) 34.6 (L) 34.3 (L) 33.8 (L) 38.8 (L) 39.3  _0 _1 249  79 (L) _2 _3  23.6 (L) 25.2 (L) 25.4 (L) 27.0 27.6 27.9 27.6 27.8 27.1 27.2 28.1 28.3 27.7 28.1 28.8 29.3 28.9 28.9 29.1 27.6 28.5  30.1 (L) 30.9 (L) 30.6 (L) 31.7 32.0 32.3 32.0 31.9 30.4 (L) 30.5 (L) 31.4 (L) 31.3 (L) 30.6 (L) 32.1 31.3 (L) 32.2 32.1 32.7 32.8 31.2 (L) 31.6  5.04 4.57 4.21 (L) 3.37 (L) 3.41 (L) 3.40 (L) 3.41 (L) 3.38 (L) 3.43 (L) 3.27 (L) 3.42 (L) 3.71 (L) 4.11 (L) 3.52 (L) 4.31 (L) 3.69 (L) 3.84 (L) 3.88 (L) 3.82 (L) 4.39 4.35 (L)  16.9 (H) 14.8 (H) 14.3 14.4 14.3 14.3 14.3 14.2 13.9 13.7 13.7 13.5 13.2 13.2 15.5 (H) 15.7 (H) 14.6 (H) 13.9 13.2 13.1 13.2  0.00 0.00 0.00 0.00 0.00 0.00 0.00 0.00 0.00 0.00 0.00 0.00 0.00 0.00 0.00 0.00 0.00 0.00 0.00 0.00 0.00  0.0 0.0 0.0 0.0 0.0 0.0 0.0 0.0 0.0 0.0 0.0 0.0 0.0 0.0 0.0 0.0 0.0 0.0 0.0 0.0 0.0  9.3 9.7 9.0 9.8 9.8 10.1 9.4 9.8 9.5 9.5 9.9 10.2 9.9 9.5 9.2 9.1 9.2 9.3 9.6 9.5 9.5  0.01  0.09 (H)            0.06 0.06 0.05 0.05 0.07 (H) 0.04 0.01  0.2  1.2 (H)                       Sodium 138 135 - 145 mmol/L  Potassium 4.1 3.5 - 5 mmol/L  Chloride 103 98 - 108 mmol/L  Carbon Dioxide  (CO2) 22 21 - 30 mmol/L  Urea Nitrogen (BUN) 10 7 - 20 mg/dL  Creatinine 0.8 0.6 - 1.3 mg/dL  Glucose 100  Comment: Interpretive Data: Above is the NONFASTING reference range.   Below are the FASTING reference ranges:  NORMAL:   70-99 mg/dL  PREDIABETES: 100-125 mg/dL  DIABETES:  > 125 mg/dL  70 - 140 mg/dL  Calcium 9.4 8.7 - 10.2 mg/dL  AST (Aspartate Aminotransferase) 20 15 - 41 U/L  ALT (Alanine Aminotransferase) 13 (  L) 17 - 63 U/L  Bilirubin, Total 0.8 0.4 - 1.5 mg/dL  Alk Phos (Alkaline Phosphatase) 80 24 - 110 U/L  Albumin 4.1 3.5 - 4.8 g/dL  Protein, Total 7.3 6.2 - 8.1 g/dL  Anion Gap 13 (H) 3 - 12 mmol/L  BUN/CREA Ratio 13 6 - 27   Glomerular Filtration Rate (eGFR)  86  Comment: Interpretive Ranges eGFR (CKD-EPI):  eGFR:   > 60 mL/min/1.73 sq m - Normal eGFR:   30 - 59 mL/min/1.73 sq m - Moderately Decreased eGFR:   15 - 29 mL/min/1.73 sq m - Severely Decreased eGFR:   < 15 mL/min/1.73 sq m - Kidney Failure  Note: These GFR calculations do not apply in acute situations  when GFR is changing rapidly or in patients on dialysis. mL/min/1.73sq m   Cholesterol, Total 137  Comment: The significance of total cholesterol depends on the values of individual components including HDL, LDL, non-HDL, and triglycerides. mg/dL  LDL (Low Density Lipoprotein), Calculated 74  Comment:  <70 mg/dL   Desired target for prior heart disease, stroke, and those at high-risk. Even lower levels may be recommended to decrease risk of heart attack and stroke. 70-159 mg/dL  Comprehensive cardiovascular risk assessment is recommended. Statin therapy may be advised based on risk factors.  160-189 mg/dL Moderately elevated LDL level. Statin therapy recommended if other risk factors present. >=190 mg/dL  Severely elevated LDL level. High long-term risk of heart disease and stroke. High-intensity statin therapy recommended for most people. Consider specialist referral. *A  healthy diet and exercise are recommended for all to reduce heart disease risk. Statin choice should be based on patient preference after patient-provider discussions.  Ref: 2018 ACC/AHA Guideline <190 mg/dL  HDL (High Density Lipoprotein) Cholesterol 52  Comment:  People with low HDL levels (see below) are at increased risk of heart disease:  <50 mg/dL for Women <40 mg/dL for Men mg/dL  Triglyceride 55  Comment:  <150 mg/dL   Normal  150-499 mg/dL  High Triglycerides. Risk of heart disease may be increased. Address reversible causes (eg sugar in foods and beverages, alcohol, and diabetes control). Medication may be appropriate based on other clinical factors.  >=500 mg/dL   Very High Triglycerides. Risk of heart disease and pancreatitis increased. Address reversible causes as above. Medication to lower triglycerides usually advised.   *Ranges provided for adults, pediatric guidelines vary. <500 mg/dL  Specimen Collected on  Blood 02/26/2018 10:56 AM       Assessment and Plan:     ICD-10-CM   1. Healthcare maintenance  Z00.00    Feeling ok given the circumstances.  Needs to keep weigh up, boost, ensure, etc.   Health Maintenance Exam: The patient's preventative maintenance and recommended screening tests for an annual wellness exam were reviewed in full today. Brought up to date unless services declined.  Counselled on the importance of diet, exercise, and its role in overall health and mortality. The patient's FH and SH was reviewed, including their home life, tobacco status, and drug and alcohol status.  Follow-up in 1 year for physical exam or additional follow-up below.  I have personally reviewed the Medicare Annual Wellness questionnaire and have noted 1. The patient's medical and social history 2. Their use of alcohol, tobacco or illicit drugs 3. Their current medications and supplements 4. The patient's functional ability including ADL's, fall risks, home safety  risks and hearing or visual             impairment. 5.  Diet and physical activities 6. Evidence for depression or mood disorders 7. Reviewed Updated provider list, see scanned forms and [facility] Snapshot.  8. Reviewed whether or not the patient has HCPOA or living will, and discussed what this means with the patient.  Recommended he bring in a copy for his chart in [facility].  The patients weight, height, BMI and visual acuity have been recorded in the chart I have made referrals, counseling and provided education to the patient based review of the above and I have provided the pt with a written personalized care plan for preventive services.  I have provided the patient with a copy of your personalized plan for preventive services. Instructed to take the time to review along with their updated medication list.  Follow-up: No follow-ups on file. Or follow-up in 1 year if not noted.  No future appointments.  No orders of the defined types were placed in this encounter.  Medications Discontinued During This Encounter  Medication Reason  . aspirin 81 MG tablet Completed Course  . atorvastatin (LIPITOR) 40 MG tablet Completed Course  . folic acid (FOLVITE) 1 MG tablet Completed Course  . hydrochlorothiazide (MICROZIDE) 12.5 MG capsule Completed Course  . losartan (COZAAR) 100 MG tablet Completed Course  . sulfaSALAzine (AZULFIDINE) 500 MG tablet Completed Course  . omeprazole (PRILOSEC) 40 MG capsule Completed Course  . Multiple Vitamins-Minerals (CENTRUM PO) Completed Course  . amLODipine (NORVASC) 5 MG tablet Completed Course   No orders of the defined types were placed in this encounter.   Signed,  Maud Deed. Cheyenne Schumm, MD   Allergies as of 09/26/2018      Reactions   Ace Inhibitors Other (See Comments)   REACTION: cough      Medication List       Accurate as of September 26, 2018 10:33 AM. If you have any questions, ask your nurse or doctor.        STOP taking these medications    amLODipine 5 MG tablet Commonly known as: NORVASC Stopped by: Owens Loffler, MD   aspirin 81 MG tablet Stopped by: Owens Loffler, MD   atorvastatin 40 MG tablet Commonly known as: LIPITOR Stopped by: Owens Loffler, MD   CENTRUM PO Stopped by: Owens Loffler, MD   folic acid 1 MG tablet Commonly known as: FOLVITE Stopped by: Owens Loffler, MD   hydrochlorothiazide 12.5 MG capsule Commonly known as: MICROZIDE Stopped by: Owens Loffler, MD   losartan 100 MG tablet Commonly known as: COZAAR Stopped by: Owens Loffler, MD   omeprazole 40 MG capsule Commonly known as: PRILOSEC Stopped by: Owens Loffler, MD   sulfaSALAzine 500 MG tablet Commonly known as: AZULFIDINE Stopped by: Owens Loffler, MD     TAKE these medications   benzonatate 200 MG capsule Commonly known as: TESSALON Take 200 mg by mouth 3 (three) times daily as needed for cough.   latanoprost 0.005 % ophthalmic solution Commonly known as: XALATAN Place 1 drop into the right eye at bedtime.

## 2018-09-26 NOTE — Patient Instructions (Addendum)
WBC (White Blood Cell Count)  Hemoglobin  Hematocrit  Platelets  MCV (Mean Corpuscular Volume)  MCH (Mean Corpuscular Hemoglobin)  MCHC (Mean Corpuscular Hemoglobin Concentration)  RBC (Red Blood Cell Count)  RDW-CV (Red Cell Distribution Width)  NRBC (Nucleated Red Blood Cell Count)  NRBC % (Nucleated Red Blood Cell %)  MPV (Mean Platelet Volume)  Immature Granulocyte Count  Immature Granulocyte %  Immature Platelet Fraction   .0 6.0 7.8 14.0 (H) 10.7 (H) 9.4 8.3 5.9 7.2 7.7 11.6 (H) 13.8 (H) 11.5 (H) 10.9 (H) 5.3 3.9 3.1 (L) 4.8 4.1 6.1 4.7  11.9 (L) 11.5 (L) 10.7 (L) 9.1 (L) 9.4 (L) 9.5 (L) 9.4 (L) 9.4 (L) 9.3 (L) 8.9 (L) 9.6 (L) 10.5 (L) 11.4 (L) 9.9 (L) 12.4 (L) 10.8 (L) 11.1 (L) 11.2 (L) 11.1 (L) 12.1 (L) 12.4 (L)  39.6 37.2 (L) 35.0 (L) 28.7 (L) 29.4 (L) 29.4 (L) 29.4 (L) 29.5 (L) 30.6 (L) 29.2 (L) 30.6 (L) 33.5 (L) 37.3 (L) 30.8 (L) 39.6 33.5 (L) 34.6 (L) 34.3 (L) 33.8 (L) 38.8 (L) 39.3  272 312 400 365 313 279 244 232 223 217  201 211 235 178 251 161 215 197 257 283  249  79 (L) 81 83 85 86 87 86 87 89 89 90  90 91 88 92 91 90 88 89 88 90   23.6 (L) 25.2 (L) 25.4 (L) 27.0 27.6 27.9 27.6 27.8 27.1 27.2 28.1 28.3 27.7 28.1 28.8 29.3 28.9 28.9 29.1 27.6 28.5  30.1 (L) 30.9 (L) 30.6 (L) 31.7 32.0 32.3 32.0 31.9 30.4 (L) 30.5 (L) 31.4 (L) 31.3 (L) 30.6 (L) 32.1 31.3 (L) 32.2 32.1 32.7 32.8 31.2 (L) 31.6  5.04 4.57 4.21 (L) 3.37 (L) 3.41 (L) 3.40 (L) 3.41 (L) 3.38 (L) 3.43 (L) 3.27 (L) 3.42 (L) 3.71 (L) 4.11 (L) 3.52 (L) 4.31 (L) 3.69 (L) 3.84 (L) 3.88 (L) 3.82 (L) 4.39 4.35 (L)  16.9 (H) 14.8 (H) 14.3 14.4 14.3 14.3 14.3 14.2 13.9 13.7 13.7 13.5 13.2 13.2 15.5 (H) 15.7 (H) 14.6 (H) 13.9 13.2 13.1 13.2  0.00 0.00 0.00 0.00 0.00 0.00 0.00 0.00 0.00 0.00 0.00 0.00 0.00 0.00 0.00 0.00 0.00 0.00 0.00 0.00 0.00  0.0 0.0 0.0 0.0 0.0 0.0 0.0 0.0 0.0 0.0 0.0 0.0 0.0 0.0 0.0 0.0 0.0 0.0 0.0 0.0 0.0  9.3 9.7 9.0 9.8 9.8 10.1 9.4 9.8 9.5 9.5 9.9 10.2 9.9 9.5 9.2 9.1 9.2 9.3 9.6 9.5 9.5  0.01  0.09  (H)            0.06 0.06 0.05 0.05 0.07 (H) 0.04 0.01  0.2  1.2 (H)                       Sodium 138 135 - 145 mmol/L  Potassium 4.1 3.5 - 5 mmol/L  Chloride 103 98 - 108 mmol/L  Carbon Dioxide (CO2) 22 21 - 30 mmol/L  Urea Nitrogen (BUN) 10 7 - 20 mg/dL  Creatinine 0.8 0.6 - 1.3 mg/dL  Glucose 100  Comment: Interpretive Data: Above is the NONFASTING reference range.   Below are the FASTING reference ranges:  NORMAL:   70-99 mg/dL  PREDIABETES: 100-125 mg/dL  DIABETES:  > 125 mg/dL  70 - 140 mg/dL  Calcium 9.4 8.7 - 10.2 mg/dL  AST (Aspartate Aminotransferase) 20 15 - 41 U/L  ALT (Alanine Aminotransferase) 13 (L) 17 - 63 U/L  Bilirubin, Total 0.8 0.4 - 1.5 mg/dL  Alk Phos (Alkaline Phosphatase) 80 24 -  110 U/L  Albumin 4.1 3.5 - 4.8 g/dL  Protein, Total 7.3 6.2 - 8.1 g/dL  Anion Gap 13 (H) 3 - 12 mmol/L  BUN/CREA Ratio 13 6 - 27   Glomerular Filtration Rate (eGFR)  86  Comment: Interpretive Ranges eGFR (CKD-EPI):  eGFR:   > 60 mL/min/1.73 sq m - Normal eGFR:   30 - 59 mL/min/1.73 sq m - Moderately Decreased eGFR:   15 - 29 mL/min/1.73 sq m - Severely Decreased eGFR:   < 15 mL/min/1.73 sq m - Kidney Failure  Note: These GFR calculations do not apply in acute situations  when GFR is changing rapidly or in patients on dialysis. mL/min/1.73sq m   Cholesterol, Total 137  Comment: The significance of total cholesterol depends on the values of individual components including HDL, LDL, non-HDL, and triglycerides. mg/dL  LDL (Low Density Lipoprotein), Calculated 74  Comment:  <70 mg/dL   Desired target for prior heart disease, stroke, and those at high-risk. Even lower levels may be recommended to decrease risk of heart attack and stroke. 70-159 mg/dL  Comprehensive cardiovascular risk assessment is recommended. Statin therapy may be advised based on risk factors.  160-189 mg/dL Moderately elevated LDL level. Statin therapy recommended if other  risk factors present. >=190 mg/dL  Severely elevated LDL level. High long-term risk of heart disease and stroke. High-intensity statin therapy recommended for most people. Consider specialist referral. *A healthy diet and exercise are recommended for all to reduce heart disease risk. Statin choice should be based on patient preference after patient-provider discussions.  Ref: 2018 ACC/AHA Guideline <190 mg/dL  HDL (High Density Lipoprotein) Cholesterol 52  Comment:  People with low HDL levels (see below) are at increased risk of heart disease:  <50 mg/dL for Women <40 mg/dL for Men mg/dL  Triglyceride 55  Comment:  <150 mg/dL   Normal  150-499 mg/dL  High Triglycerides. Risk of heart disease may be increased. Address reversible causes (eg sugar in foods and beverages, alcohol, and diabetes control). Medication may be appropriate based on other clinical factors.  >=500 mg/dL   Very High Triglycerides. Risk of heart disease and pancreatitis increased. Address reversible causes as above. Medication to lower triglycerides usually advised.   *Ranges provided for adults, pediatric guidelines vary. <500 mg/dL  Specimen Collected on  Blood 02/26/2018 10:56 AM

## 2018-10-24 DIAGNOSIS — Z961 Presence of intraocular lens: Secondary | ICD-10-CM | POA: Diagnosis not present

## 2018-10-24 DIAGNOSIS — H2512 Age-related nuclear cataract, left eye: Secondary | ICD-10-CM | POA: Diagnosis not present

## 2018-10-24 DIAGNOSIS — K50919 Crohn's disease, unspecified, with unspecified complications: Secondary | ICD-10-CM | POA: Diagnosis not present

## 2018-10-24 DIAGNOSIS — H401121 Primary open-angle glaucoma, left eye, mild stage: Secondary | ICD-10-CM | POA: Diagnosis not present

## 2018-10-24 DIAGNOSIS — I10 Essential (primary) hypertension: Secondary | ICD-10-CM | POA: Diagnosis not present

## 2018-10-24 DIAGNOSIS — K603 Anal fistula: Secondary | ICD-10-CM | POA: Diagnosis not present

## 2018-10-24 DIAGNOSIS — C155 Malignant neoplasm of lower third of esophagus: Secondary | ICD-10-CM | POA: Diagnosis not present

## 2018-10-24 DIAGNOSIS — H401130 Primary open-angle glaucoma, bilateral, stage unspecified: Secondary | ICD-10-CM | POA: Diagnosis not present

## 2018-10-24 DIAGNOSIS — R1310 Dysphagia, unspecified: Secondary | ICD-10-CM | POA: Diagnosis not present

## 2018-10-24 DIAGNOSIS — I2510 Atherosclerotic heart disease of native coronary artery without angina pectoris: Secondary | ICD-10-CM | POA: Diagnosis not present

## 2018-10-24 DIAGNOSIS — H401112 Primary open-angle glaucoma, right eye, moderate stage: Secondary | ICD-10-CM | POA: Diagnosis not present

## 2018-10-26 DIAGNOSIS — K222 Esophageal obstruction: Secondary | ICD-10-CM | POA: Diagnosis not present

## 2018-10-26 DIAGNOSIS — C159 Malignant neoplasm of esophagus, unspecified: Secondary | ICD-10-CM | POA: Diagnosis not present

## 2018-10-26 DIAGNOSIS — Z87891 Personal history of nicotine dependence: Secondary | ICD-10-CM | POA: Diagnosis not present

## 2018-10-26 DIAGNOSIS — Z8501 Personal history of malignant neoplasm of esophagus: Secondary | ICD-10-CM | POA: Diagnosis not present

## 2018-10-26 DIAGNOSIS — Z9049 Acquired absence of other specified parts of digestive tract: Secondary | ICD-10-CM | POA: Diagnosis not present

## 2018-10-26 DIAGNOSIS — Z923 Personal history of irradiation: Secondary | ICD-10-CM | POA: Diagnosis not present

## 2018-10-26 DIAGNOSIS — Z9221 Personal history of antineoplastic chemotherapy: Secondary | ICD-10-CM | POA: Diagnosis not present

## 2018-10-26 DIAGNOSIS — K5090 Crohn's disease, unspecified, without complications: Secondary | ICD-10-CM | POA: Diagnosis not present

## 2018-10-26 DIAGNOSIS — I2510 Atherosclerotic heart disease of native coronary artery without angina pectoris: Secondary | ICD-10-CM | POA: Diagnosis not present

## 2018-10-26 DIAGNOSIS — I10 Essential (primary) hypertension: Secondary | ICD-10-CM | POA: Diagnosis not present

## 2018-11-09 ENCOUNTER — Other Ambulatory Visit: Payer: Self-pay

## 2018-11-09 NOTE — Patient Outreach (Signed)
[facility]) Care Management  11/09/2018  [name] [birth year] [id number]   Medication Adherence call to [name] HIPPA Compliant Voice message left with a call back number. [name] is showing past due on Losartan 100 mg under [place].    [facility] Direct Dial [phone]  Fax [phone] [email or web address]

## 2018-12-06 DIAGNOSIS — C155 Malignant neoplasm of lower third of esophagus: Secondary | ICD-10-CM | POA: Diagnosis not present

## 2018-12-06 DIAGNOSIS — D649 Anemia, unspecified: Secondary | ICD-10-CM | POA: Diagnosis not present

## 2018-12-06 DIAGNOSIS — Z9049 Acquired absence of other specified parts of digestive tract: Secondary | ICD-10-CM | POA: Diagnosis not present

## 2018-12-06 DIAGNOSIS — E279 Disorder of adrenal gland, unspecified: Secondary | ICD-10-CM | POA: Diagnosis not present

## 2018-12-06 DIAGNOSIS — R918 Other nonspecific abnormal finding of lung field: Secondary | ICD-10-CM | POA: Diagnosis not present

## 2018-12-06 DIAGNOSIS — Z8719 Personal history of other diseases of the digestive system: Secondary | ICD-10-CM | POA: Diagnosis not present

## 2018-12-28 DIAGNOSIS — C155 Malignant neoplasm of lower third of esophagus: Secondary | ICD-10-CM | POA: Diagnosis not present

## 2018-12-31 DIAGNOSIS — Z87891 Personal history of nicotine dependence: Secondary | ICD-10-CM | POA: Diagnosis not present

## 2018-12-31 DIAGNOSIS — K222 Esophageal obstruction: Secondary | ICD-10-CM | POA: Diagnosis not present

## 2018-12-31 DIAGNOSIS — I10 Essential (primary) hypertension: Secondary | ICD-10-CM | POA: Diagnosis not present

## 2018-12-31 DIAGNOSIS — Z833 Family history of diabetes mellitus: Secondary | ICD-10-CM | POA: Diagnosis not present

## 2018-12-31 DIAGNOSIS — Z9049 Acquired absence of other specified parts of digestive tract: Secondary | ICD-10-CM | POA: Diagnosis not present

## 2018-12-31 DIAGNOSIS — Z9221 Personal history of antineoplastic chemotherapy: Secondary | ICD-10-CM | POA: Diagnosis not present

## 2018-12-31 DIAGNOSIS — I2510 Atherosclerotic heart disease of native coronary artery without angina pectoris: Secondary | ICD-10-CM | POA: Diagnosis not present

## 2018-12-31 DIAGNOSIS — Z79899 Other long term (current) drug therapy: Secondary | ICD-10-CM | POA: Diagnosis not present

## 2018-12-31 DIAGNOSIS — Z8719 Personal history of other diseases of the digestive system: Secondary | ICD-10-CM | POA: Diagnosis not present

## 2018-12-31 DIAGNOSIS — Z923 Personal history of irradiation: Secondary | ICD-10-CM | POA: Diagnosis not present

## 2018-12-31 DIAGNOSIS — Z8249 Family history of ischemic heart disease and other diseases of the circulatory system: Secondary | ICD-10-CM | POA: Diagnosis not present

## 2018-12-31 DIAGNOSIS — Z8501 Personal history of malignant neoplasm of esophagus: Secondary | ICD-10-CM | POA: Diagnosis not present

## 2018-12-31 DIAGNOSIS — C155 Malignant neoplasm of lower third of esophagus: Secondary | ICD-10-CM | POA: Diagnosis not present

## 2019-01-24 DIAGNOSIS — K50919 Crohn's disease, unspecified, with unspecified complications: Secondary | ICD-10-CM | POA: Diagnosis not present

## 2019-02-05 ENCOUNTER — Ambulatory Visit: Payer: Medicare Other | Attending: Internal Medicine

## 2019-02-05 DIAGNOSIS — Z23 Encounter for immunization: Secondary | ICD-10-CM | POA: Insufficient documentation

## 2019-02-05 NOTE — Progress Notes (Signed)
   Covid-19 Vaccination Clinic  Name:  BOSS DANIELSEN    MRN: [id number] DOB: May 18, 1940  02/05/2019  Mr. Blue was observed post Covid-19 immunization for 30 minutes based on pre-vaccination screening without incidence. He was provided with Vaccine Information Sheet and instruction to access the V-Safe system.   Mr. Breon was instructed to call 911 with any severe reactions post vaccine: Marland Kitchen Difficulty breathing  . Swelling of your face and throat  . A fast heartbeat  . A bad rash all over your body  . Dizziness and weakness    Immunizations Administered    Name Date Dose VIS Date Route   Pfizer COVID-19 Vaccine 02/05/2019 11:22 AM 0.3 mL 01/04/2019 Intramuscular   Manufacturer: [address]   Lot: [id number]   [place]: [id number]

## 2019-02-25 ENCOUNTER — Ambulatory Visit: Payer: Medicare Other

## 2019-02-25 ENCOUNTER — Ambulatory Visit: Payer: Medicare Other | Attending: Internal Medicine

## 2019-02-25 DIAGNOSIS — Z23 Encounter for immunization: Secondary | ICD-10-CM

## 2019-02-25 NOTE — Progress Notes (Signed)
   Covid-19 Vaccination Clinic  Name:  Jeffery [name]    MRN: [id number] DOB: 03/20/1940  02/25/2019  [name] was observed post Covid-19 immunization for 15 minutes without incidence. He was provided with Vaccine Information Sheet and instruction to access the V-Safe system.   [name] was instructed to call 911 with any severe reactions post vaccine: Marland Kitchen Difficulty breathing  . Swelling of your face and throat  . A fast heartbeat  . A bad rash all over your body  . Dizziness and weakness    Immunizations Administered    Name Date Dose VIS Date Route   Pfizer COVID-19 Vaccine 02/25/2019  8:24 AM 0.3 mL 01/04/2019 Intramuscular   Manufacturer: [place]   Lot: [id number]   [place]: [id number]

## 2020-01-15 IMAGING — CT CT ABD-PELV W/ CM
3 of 6 series · 8 of 46 positions shown, 15 images · IV contrast (ISOVUE 300)
Comparison: None.

CLINICAL DATA: Newly diagnosed esophageal mass

EXAM:
CT CHEST, ABDOMEN, AND PELVIS WITH CONTRAST
TECHNIQUE: Multidetector CT imaging of the chest, abdomen and pelvis was
performed following the standard protocol during bolus
administration of intravenous contrast.
CONTRAST:  100mL F0PLNG-XUU IOPAMIDOL (F0PLNG-XUU) INJECTION 61%

[Series 2: stomach/dist esophagus · axial · 0.77mm/px · z∈[-492,-118]mm · 4 of 127 slices shown, 9 images]
[im 26/127  soft-tissue]
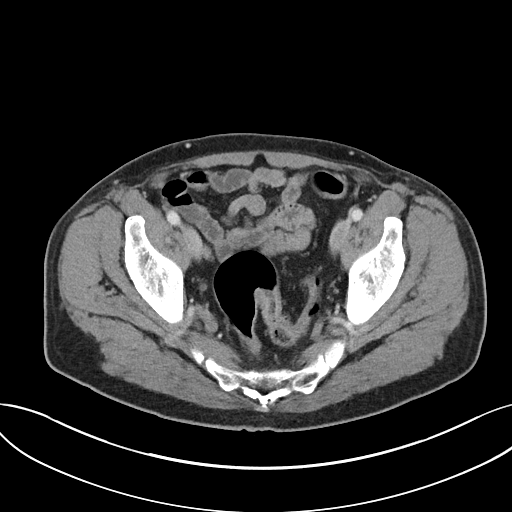
[im 26/127  lung]
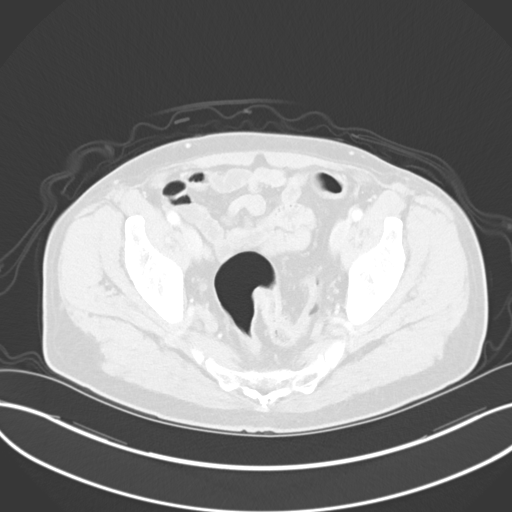
[im 26/127  bone]
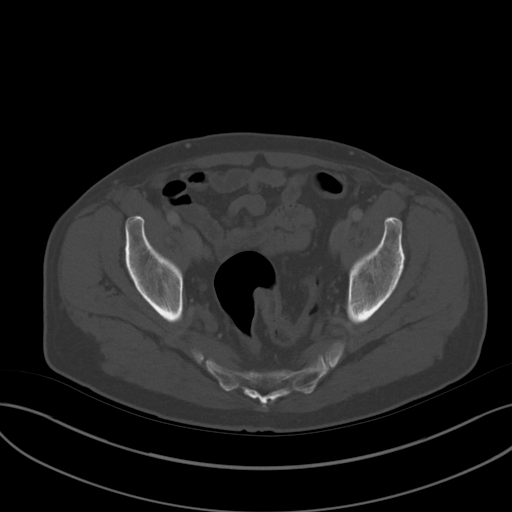
[im 51/127  soft-tissue]
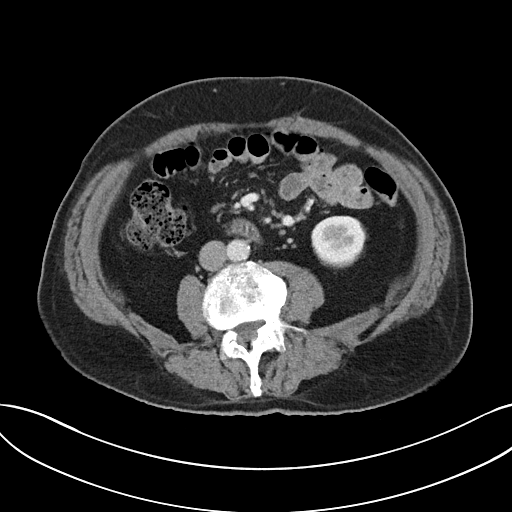
[im 51/127  lung]
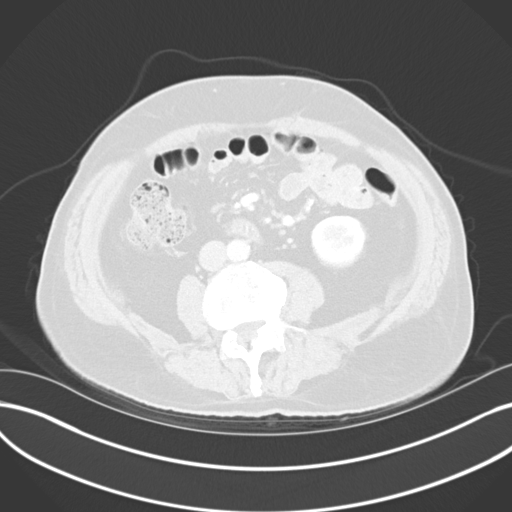
[im 76/127  soft-tissue]
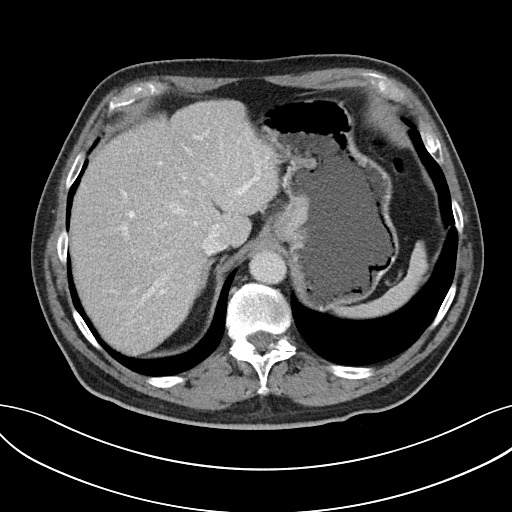
[im 76/127  lung]
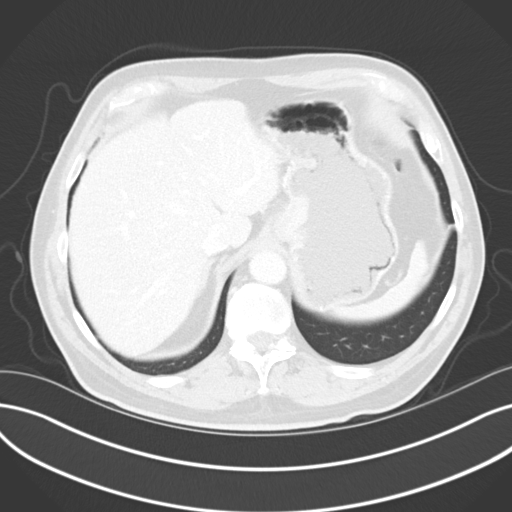
[im 101/127  soft-tissue]
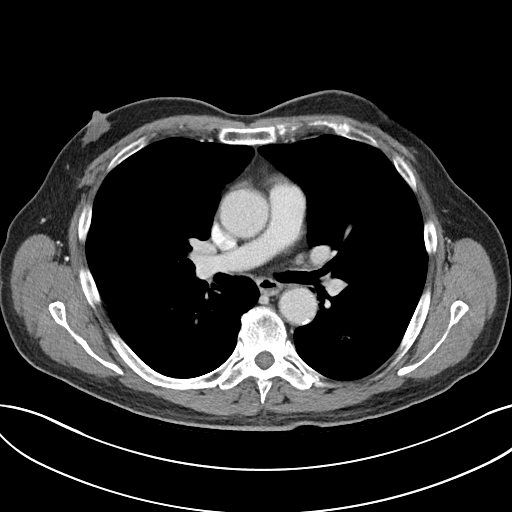
[im 101/127  lung]
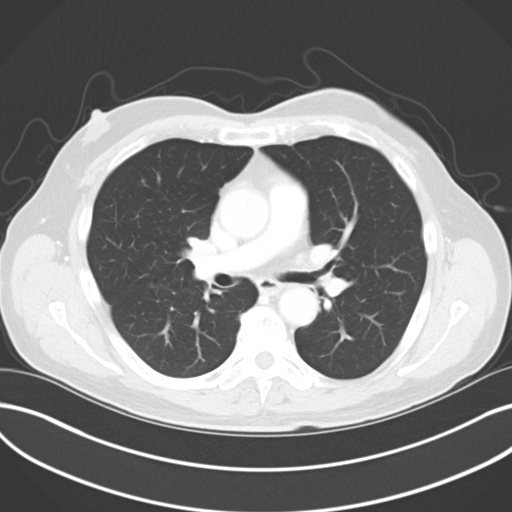

[Series 8: coronal soft tissue · coronal · 0.94mm/px · 3 of 96 slices shown, 4 images]
[im 32/96  soft-tissue]
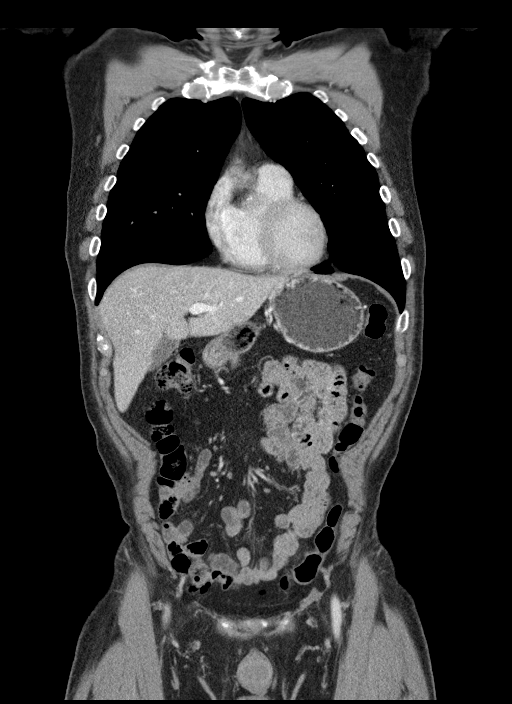
[im 43/96  soft-tissue]
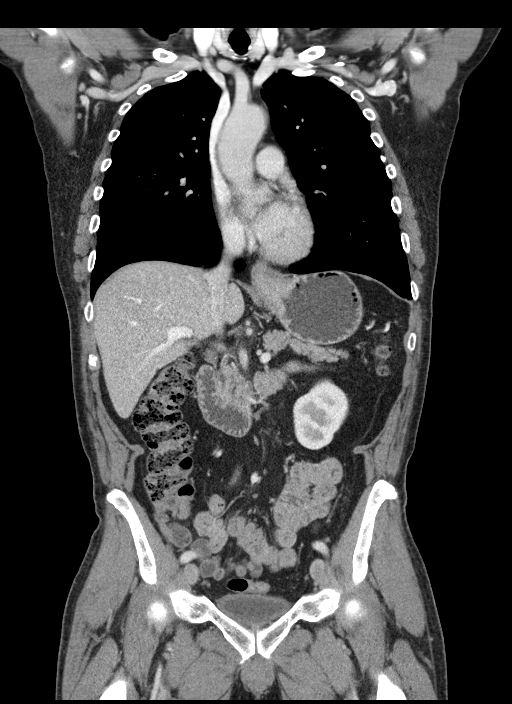
[im 43/96  bone]
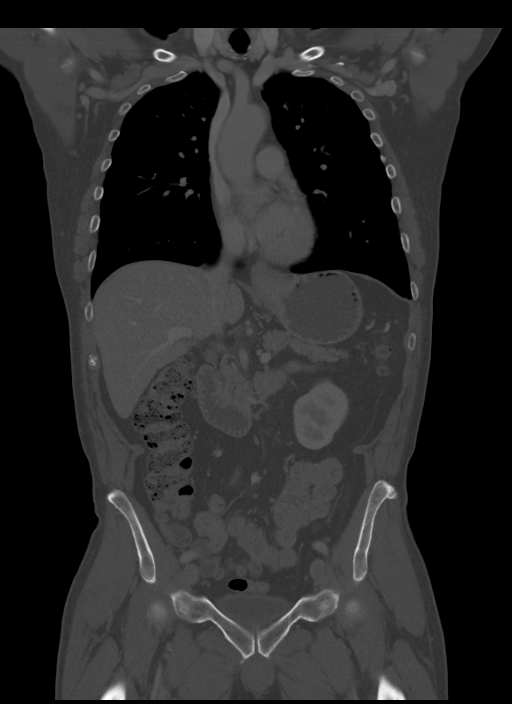
[im 53/96  soft-tissue]
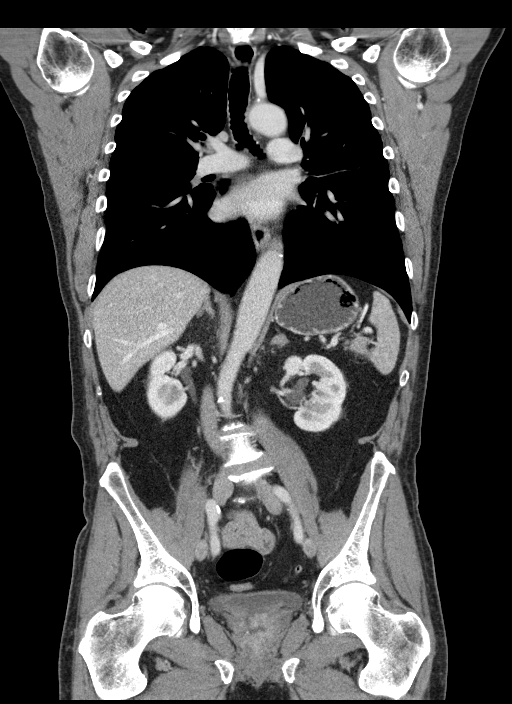

[Series 9: sagittal soft tissue · sagittal · 0.62mm/px · 1 of 118 slices shown, 2 images]
[im 40/118  soft-tissue]
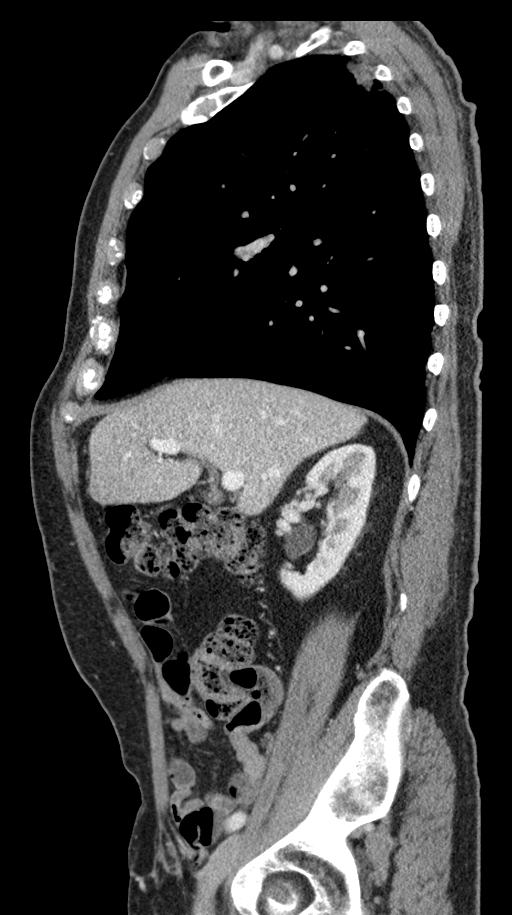
[im 40/118  bone]
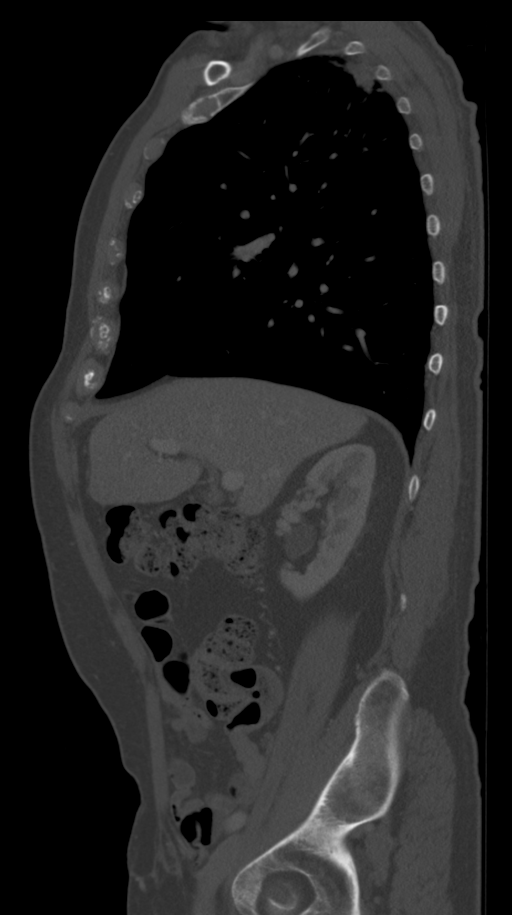

[8 of 46 positions shown; findings below may reference images not displayed]

FINDINGS: CT CHEST FINDINGS

Cardiovascular: The heart is normal in size. No pericardial
effusion.

No evidence of thoracic aortic aneurysm. Mild atherosclerotic
calcifications the sing thoracic aorta.

Mediastinum/Nodes: No suspicious mediastinal, hilar, or axillary
lymphadenopathy.

Visualized thyroid is unremarkable.

Masslike thickening of the distal esophagus at the GE junction
(series 2/image 50), corresponding to the patient's known esophageal
mass.

Lungs/Pleura: Pleural-parenchymal scarring along the posterior upper
lobes bilaterally.

No suspicious pulmonary nodules.

No focal consolidation.

No pleural effusion or pneumothorax.

Musculoskeletal: Mild degenerative changes of the mid thoracic
spine.

CT ABDOMEN PELVIS FINDINGS

Hepatobiliary: Liver is within normal limits. No
suspicious/enhancing hepatic lesions.

Gallbladder is unremarkable. No intrahepatic or extrahepatic ductal
dilatation.

Pancreas: Within normal limits.

Spleen: Within normal limits.

Adrenals/Urinary Tract: 18 mm left adrenal nodule (series 2/image
60), indeterminate. Right adrenal glands are within normal limits.

Kidneys are within normal limits.  No hydronephrosis.

Bladder is underdistended although unremarkable.

Stomach/Bowel: GE junction mass, as noted above. Stomach is
otherwise within normal limits.

No evidence of bowel obstruction.

Normal appendix (series 2/image 90).

Very mild sigmoid diverticulosis, without evidence of
diverticulitis.

Vascular/Lymphatic: No evidence of abdominal aortic aneurysm.

Atherosclerotic calcifications of the abdominal aorta and branch
vessels.

No suspicious abdominopelvic lymphadenopathy.

Reproductive: Mild prostatomegaly. Suspected postsurgical changes
related to prior TURP.

Other: No abdominopelvic ascites.

Postsurgical changes related to prior left inguinal hernia repair.

Musculoskeletal: Mild lumbar dextroscoliosis with degenerative
changes.
IMPRESSION: Masslike thickening of the distal esophagus at the GE junction,
corresponding to known esophageal adenocarcinoma.

18 mm left adrenal nodule, indeterminate. Statistically, this still
likely reflects a benign adrenal adenoma. Consider CT abdomen
with/without contrast (adrenal protocol) for further
characterization.

Otherwise, no findings suspicious for metastatic disease.
